# Patient Record
Sex: Female | Born: 1965 | Race: White | Hispanic: No | Marital: Single | Smoking: Never smoker
Health system: Southern US, Community
[De-identification: ages and names within clinical notes are randomized; demographics above are authoritative.]

## PROBLEM LIST (undated history)

## (undated) DIAGNOSIS — G4733 Obstructive sleep apnea (adult) (pediatric): Secondary | ICD-10-CM

## (undated) DIAGNOSIS — N179 Acute kidney failure, unspecified: Secondary | ICD-10-CM

## (undated) DIAGNOSIS — J189 Pneumonia, unspecified organism: Secondary | ICD-10-CM

## (undated) DIAGNOSIS — E785 Hyperlipidemia, unspecified: Secondary | ICD-10-CM

## (undated) DIAGNOSIS — I872 Venous insufficiency (chronic) (peripheral): Secondary | ICD-10-CM

## (undated) DIAGNOSIS — Z9981 Dependence on supplemental oxygen: Secondary | ICD-10-CM

## (undated) DIAGNOSIS — E039 Hypothyroidism, unspecified: Secondary | ICD-10-CM

## (undated) DIAGNOSIS — G43909 Migraine, unspecified, not intractable, without status migrainosus: Secondary | ICD-10-CM

## (undated) DIAGNOSIS — I10 Essential (primary) hypertension: Secondary | ICD-10-CM

## (undated) DIAGNOSIS — J45909 Unspecified asthma, uncomplicated: Secondary | ICD-10-CM

## (undated) DIAGNOSIS — R011 Cardiac murmur, unspecified: Secondary | ICD-10-CM

## (undated) DIAGNOSIS — Z9989 Dependence on other enabling machines and devices: Secondary | ICD-10-CM

## (undated) DIAGNOSIS — I82409 Acute embolism and thrombosis of unspecified deep veins of unspecified lower extremity: Secondary | ICD-10-CM

## (undated) DIAGNOSIS — B379 Candidiasis, unspecified: Secondary | ICD-10-CM

## (undated) DIAGNOSIS — N189 Chronic kidney disease, unspecified: Secondary | ICD-10-CM

## (undated) DIAGNOSIS — I509 Heart failure, unspecified: Secondary | ICD-10-CM

## (undated) DIAGNOSIS — J42 Unspecified chronic bronchitis: Secondary | ICD-10-CM

## (undated) HISTORY — PX: KNEE DISLOCATION SURGERY: SHX689

---

## 1998-06-06 ENCOUNTER — Other Ambulatory Visit: Admission: RE | Admit: 1998-06-06 | Discharge: 1998-06-06 | Payer: Self-pay | Admitting: Obstetrics & Gynecology

## 2016-01-19 ENCOUNTER — Inpatient Hospital Stay (HOSPITAL_COMMUNITY)
Admission: AD | Admit: 2016-01-19 | Discharge: 2016-01-31 | DRG: 286 | Disposition: A | Discharge status: Home Health | Payer: PRIVATE HEALTH INSURANCE | Source: Other Acute Inpatient Hospital | Attending: Internal Medicine | Admitting: Internal Medicine

## 2016-01-19 ENCOUNTER — Encounter (HOSPITAL_COMMUNITY): Payer: Self-pay | Admitting: Internal Medicine

## 2016-01-19 DIAGNOSIS — R778 Other specified abnormalities of plasma proteins: Secondary | ICD-10-CM

## 2016-01-19 DIAGNOSIS — E861 Hypovolemia: Secondary | ICD-10-CM | POA: Diagnosis not present

## 2016-01-19 DIAGNOSIS — E860 Dehydration: Secondary | ICD-10-CM | POA: Diagnosis not present

## 2016-01-19 DIAGNOSIS — G4733 Obstructive sleep apnea (adult) (pediatric): Secondary | ICD-10-CM | POA: Diagnosis not present

## 2016-01-19 DIAGNOSIS — I509 Heart failure, unspecified: Secondary | ICD-10-CM | POA: Diagnosis not present

## 2016-01-19 DIAGNOSIS — I872 Venous insufficiency (chronic) (peripheral): Secondary | ICD-10-CM | POA: Diagnosis present

## 2016-01-19 DIAGNOSIS — J9601 Acute respiratory failure with hypoxia: Secondary | ICD-10-CM | POA: Diagnosis present

## 2016-01-19 DIAGNOSIS — N170 Acute kidney failure with tubular necrosis: Secondary | ICD-10-CM | POA: Diagnosis not present

## 2016-01-19 DIAGNOSIS — E872 Acidosis: Secondary | ICD-10-CM | POA: Diagnosis present

## 2016-01-19 DIAGNOSIS — R197 Diarrhea, unspecified: Secondary | ICD-10-CM

## 2016-01-19 DIAGNOSIS — K5730 Diverticulosis of large intestine without perforation or abscess without bleeding: Secondary | ICD-10-CM | POA: Diagnosis present

## 2016-01-19 DIAGNOSIS — Z9989 Dependence on other enabling machines and devices: Secondary | ICD-10-CM

## 2016-01-19 DIAGNOSIS — R109 Unspecified abdominal pain: Secondary | ICD-10-CM | POA: Diagnosis present

## 2016-01-19 DIAGNOSIS — I248 Other forms of acute ischemic heart disease: Secondary | ICD-10-CM | POA: Diagnosis not present

## 2016-01-19 DIAGNOSIS — R578 Other shock: Secondary | ICD-10-CM | POA: Diagnosis not present

## 2016-01-19 DIAGNOSIS — R001 Bradycardia, unspecified: Secondary | ICD-10-CM

## 2016-01-19 DIAGNOSIS — Z880 Allergy status to penicillin: Secondary | ICD-10-CM

## 2016-01-19 DIAGNOSIS — E785 Hyperlipidemia, unspecified: Secondary | ICD-10-CM | POA: Diagnosis not present

## 2016-01-19 DIAGNOSIS — B379 Candidiasis, unspecified: Secondary | ICD-10-CM | POA: Diagnosis not present

## 2016-01-19 DIAGNOSIS — N189 Chronic kidney disease, unspecified: Secondary | ICD-10-CM | POA: Diagnosis present

## 2016-01-19 DIAGNOSIS — R1084 Generalized abdominal pain: Secondary | ICD-10-CM | POA: Diagnosis not present

## 2016-01-19 DIAGNOSIS — I5032 Chronic diastolic (congestive) heart failure: Secondary | ICD-10-CM | POA: Diagnosis present

## 2016-01-19 DIAGNOSIS — R079 Chest pain, unspecified: Secondary | ICD-10-CM | POA: Diagnosis present

## 2016-01-19 DIAGNOSIS — T80818A Extravasation of other vesicant agent, initial encounter: Secondary | ICD-10-CM | POA: Diagnosis not present

## 2016-01-19 DIAGNOSIS — J986 Disorders of diaphragm: Secondary | ICD-10-CM | POA: Diagnosis present

## 2016-01-19 DIAGNOSIS — K529 Noninfective gastroenteritis and colitis, unspecified: Secondary | ICD-10-CM | POA: Diagnosis present

## 2016-01-19 DIAGNOSIS — I272 Other secondary pulmonary hypertension: Secondary | ICD-10-CM | POA: Diagnosis not present

## 2016-01-19 DIAGNOSIS — R112 Nausea with vomiting, unspecified: Secondary | ICD-10-CM | POA: Diagnosis present

## 2016-01-19 DIAGNOSIS — R9431 Abnormal electrocardiogram [ECG] [EKG]: Secondary | ICD-10-CM

## 2016-01-19 DIAGNOSIS — F419 Anxiety disorder, unspecified: Secondary | ICD-10-CM | POA: Diagnosis not present

## 2016-01-19 DIAGNOSIS — I130 Hypertensive heart and chronic kidney disease with heart failure and stage 1 through stage 4 chronic kidney disease, or unspecified chronic kidney disease: Secondary | ICD-10-CM | POA: Diagnosis present

## 2016-01-19 DIAGNOSIS — I82431 Acute embolism and thrombosis of right popliteal vein: Secondary | ICD-10-CM | POA: Diagnosis present

## 2016-01-19 DIAGNOSIS — N179 Acute kidney failure, unspecified: Secondary | ICD-10-CM

## 2016-01-19 DIAGNOSIS — Z6841 Body Mass Index (BMI) 40.0 and over, adult: Secondary | ICD-10-CM

## 2016-01-19 DIAGNOSIS — I10 Essential (primary) hypertension: Secondary | ICD-10-CM | POA: Diagnosis present

## 2016-01-19 DIAGNOSIS — E875 Hyperkalemia: Secondary | ICD-10-CM | POA: Diagnosis present

## 2016-01-19 DIAGNOSIS — Z66 Do not resuscitate: Secondary | ICD-10-CM | POA: Diagnosis not present

## 2016-01-19 DIAGNOSIS — R7989 Other specified abnormal findings of blood chemistry: Secondary | ICD-10-CM

## 2016-01-19 DIAGNOSIS — I959 Hypotension, unspecified: Secondary | ICD-10-CM | POA: Diagnosis present

## 2016-01-19 DIAGNOSIS — J9600 Acute respiratory failure, unspecified whether with hypoxia or hypercapnia: Secondary | ICD-10-CM

## 2016-01-19 DIAGNOSIS — I4510 Unspecified right bundle-branch block: Secondary | ICD-10-CM | POA: Diagnosis present

## 2016-01-19 DIAGNOSIS — R579 Shock, unspecified: Secondary | ICD-10-CM | POA: Diagnosis present

## 2016-01-19 DIAGNOSIS — I214 Non-ST elevation (NSTEMI) myocardial infarction: Secondary | ICD-10-CM | POA: Insufficient documentation

## 2016-01-19 DIAGNOSIS — J9690 Respiratory failure, unspecified, unspecified whether with hypoxia or hypercapnia: Secondary | ICD-10-CM

## 2016-01-19 DIAGNOSIS — Z79899 Other long term (current) drug therapy: Secondary | ICD-10-CM

## 2016-01-19 DIAGNOSIS — E039 Hypothyroidism, unspecified: Secondary | ICD-10-CM | POA: Diagnosis not present

## 2016-01-19 DIAGNOSIS — E876 Hypokalemia: Secondary | ICD-10-CM | POA: Diagnosis not present

## 2016-01-19 DIAGNOSIS — R0902 Hypoxemia: Secondary | ICD-10-CM | POA: Insufficient documentation

## 2016-01-19 HISTORY — DX: Cardiac murmur, unspecified: R01.1

## 2016-01-19 HISTORY — DX: Heart failure, unspecified: I50.9

## 2016-01-19 HISTORY — DX: Chronic kidney disease, unspecified: N18.9

## 2016-01-19 HISTORY — DX: Migraine, unspecified, not intractable, without status migrainosus: G43.909

## 2016-01-19 HISTORY — DX: Unspecified asthma, uncomplicated: J45.909

## 2016-01-19 HISTORY — DX: Hyperlipidemia, unspecified: E78.5

## 2016-01-19 HISTORY — DX: Venous insufficiency (chronic) (peripheral): I87.2

## 2016-01-19 HISTORY — DX: Unspecified chronic bronchitis: J42

## 2016-01-19 HISTORY — DX: Hypothyroidism, unspecified: E03.9

## 2016-01-19 HISTORY — DX: Dependence on supplemental oxygen: Z99.81

## 2016-01-19 HISTORY — DX: Essential (primary) hypertension: I10

## 2016-01-19 HISTORY — DX: Acute embolism and thrombosis of unspecified deep veins of unspecified lower extremity: I82.409

## 2016-01-19 HISTORY — DX: Acute kidney failure, unspecified: N17.9

## 2016-01-19 HISTORY — DX: Dependence on other enabling machines and devices: Z99.89

## 2016-01-19 HISTORY — DX: Candidiasis, unspecified: B37.9

## 2016-01-19 HISTORY — DX: Pneumonia, unspecified organism: J18.9

## 2016-01-19 HISTORY — DX: Obstructive sleep apnea (adult) (pediatric): G47.33

## 2016-01-19 LAB — APTT: aPTT: 31 seconds (ref 24–37)

## 2016-01-19 LAB — LACTIC ACID, PLASMA: LACTIC ACID, VENOUS: 0.9 mmol/L (ref 0.5–1.9)

## 2016-01-19 LAB — COMPREHENSIVE METABOLIC PANEL
ALK PHOS: 52 U/L (ref 38–126)
ALT: 13 U/L — AB (ref 14–54)
AST: 11 U/L — AB (ref 15–41)
Albumin: 3.2 g/dL — ABNORMAL LOW (ref 3.5–5.0)
Anion gap: 16 — ABNORMAL HIGH (ref 5–15)
BILIRUBIN TOTAL: 0.9 mg/dL (ref 0.3–1.2)
BUN: 175 mg/dL — AB (ref 6–20)
CALCIUM: 7.5 mg/dL — AB (ref 8.9–10.3)
CO2: 11 mmol/L — ABNORMAL LOW (ref 22–32)
CREATININE: 7.58 mg/dL — AB (ref 0.44–1.00)
Chloride: 111 mmol/L (ref 101–111)
GFR, EST AFRICAN AMERICAN: 7 mL/min — AB (ref >60)
GFR, EST NON AFRICAN AMERICAN: 6 mL/min — AB (ref >60)
Glucose, Bld: 72 mg/dL (ref 65–99)
Potassium: 5.1 mmol/L (ref 3.5–5.1)
Sodium: 138 mmol/L (ref 135–145)
Total Protein: 6.5 g/dL (ref 6.5–8.1)

## 2016-01-19 LAB — BRAIN NATRIURETIC PEPTIDE: B Natriuretic Peptide: 467.4 pg/mL — ABNORMAL HIGH (ref 0.0–100.0)

## 2016-01-19 LAB — PROTIME-INR
INR: 1.35 (ref 0.00–1.49)
Prothrombin Time: 16.8 seconds — ABNORMAL HIGH (ref 11.6–15.2)

## 2016-01-19 MED ORDER — SODIUM CHLORIDE 0.9 % IV BOLUS (SEPSIS)
1000.0000 mL | Freq: Once | INTRAVENOUS | Status: AC
  Administered 2016-01-19: 1000 mL via INTRAVENOUS

## 2016-01-19 MED ORDER — ACETAMINOPHEN 325 MG PO TABS
650.0000 mg | ORAL_TABLET | ORAL | Status: DC | PRN
  Administered 2016-01-21 – 2016-01-30 (×2): 650 mg via ORAL
  Filled 2016-01-19: qty 2

## 2016-01-19 MED ORDER — ATORVASTATIN CALCIUM 40 MG PO TABS
40.0000 mg | ORAL_TABLET | Freq: Every day | ORAL | Status: DC
  Administered 2016-01-20 – 2016-01-30 (×11): 40 mg via ORAL
  Filled 2016-01-19 (×12): qty 1

## 2016-01-19 MED ORDER — SODIUM CHLORIDE 0.9 % IV SOLN
INTRAVENOUS | Status: DC
  Administered 2016-01-20 (×2): via INTRAVENOUS

## 2016-01-19 MED ORDER — CIPROFLOXACIN IN D5W 400 MG/200ML IV SOLN: 400.0000 mg | Freq: Two times a day (BID) | INTRAVENOUS | Status: DC

## 2016-01-19 MED ORDER — ASPIRIN EC 325 MG PO TBEC
325.0000 mg | DELAYED_RELEASE_TABLET | Freq: Every day | ORAL | Status: DC
  Administered 2016-01-20 – 2016-01-23 (×5): 325 mg via ORAL
  Filled 2016-01-19 (×5): qty 1

## 2016-01-19 MED ORDER — LEVOTHYROXINE SODIUM 75 MCG PO TABS
175.0000 ug | ORAL_TABLET | Freq: Every day | ORAL | Status: DC
  Administered 2016-01-20 – 2016-01-31 (×12): 175 ug via ORAL
  Filled 2016-01-19 (×14): qty 1

## 2016-01-19 MED ORDER — ALBUTEROL SULFATE (2.5 MG/3ML) 0.083% IN NEBU: 2.5000 mg | INHALATION_SOLUTION | RESPIRATORY_TRACT | Status: DC | PRN

## 2016-01-19 MED ORDER — HEPARIN SODIUM (PORCINE) 5000 UNIT/ML IJ SOLN
5000.0000 [IU] | Freq: Three times a day (TID) | INTRAMUSCULAR | Status: DC
  Administered 2016-01-20 (×2): 5000 [IU] via SUBCUTANEOUS
  Filled 2016-01-19 (×3): qty 1

## 2016-01-19 MED ORDER — FLUCONAZOLE 100 MG PO TABS
100.0000 mg | ORAL_TABLET | Freq: Every day | ORAL | Status: AC
  Administered 2016-01-20 – 2016-01-26 (×7): 100 mg via ORAL
  Filled 2016-01-19 (×9): qty 1

## 2016-01-19 MED ORDER — ONDANSETRON HCL 4 MG/2ML IJ SOLN
4.0000 mg | Freq: Four times a day (QID) | INTRAMUSCULAR | Status: DC | PRN
  Administered 2016-01-20 – 2016-01-21 (×6): 4 mg via INTRAVENOUS
  Filled 2016-01-19 (×6): qty 2

## 2016-01-19 MED ORDER — CIPROFLOXACIN IN D5W 400 MG/200ML IV SOLN
400.0000 mg | Freq: Every day | INTRAVENOUS | Status: DC
  Administered 2016-01-20 – 2016-01-21 (×2): 400 mg via INTRAVENOUS
  Filled 2016-01-19 (×4): qty 200

## 2016-01-19 MED ORDER — METRONIDAZOLE IN NACL 5-0.79 MG/ML-% IV SOLN
500.0000 mg | Freq: Three times a day (TID) | INTRAVENOUS | Status: DC
  Administered 2016-01-19 – 2016-01-22 (×8): 500 mg via INTRAVENOUS
  Filled 2016-01-19 (×10): qty 100

## 2016-01-19 MED ORDER — ONDANSETRON HCL 4 MG/2ML IJ SOLN: 4.0000 mg | Freq: Three times a day (TID) | INTRAMUSCULAR | Status: DC | PRN

## 2016-01-19 MED ORDER — MOMETASONE FURO-FORMOTEROL FUM 200-5 MCG/ACT IN AERO
2.0000 | INHALATION_SPRAY | Freq: Two times a day (BID) | RESPIRATORY_TRACT | Status: DC
  Administered 2016-01-21 – 2016-01-31 (×19): 2 via RESPIRATORY_TRACT
  Filled 2016-01-19 (×2): qty 8.8

## 2016-01-19 MED ORDER — MORPHINE SULFATE (PF) 2 MG/ML IV SOLN: 2.0000 mg | INTRAVENOUS | Status: DC | PRN

## 2016-01-19 MED ORDER — LEVOTHYROXINE SODIUM 100 MCG IV SOLR: 175.0000 ug | Freq: Every day | INTRAVENOUS | Status: DC

## 2016-01-19 MED ORDER — MORPHINE SULFATE (PF) 2 MG/ML IV SOLN
2.0000 mg | INTRAVENOUS | Status: DC | PRN
  Administered 2016-01-20: 2 mg via INTRAVENOUS
  Filled 2016-01-19: qty 1

## 2016-01-19 NOTE — Significant Event (Addendum)
Rapid Response Event Note Asked to see Patient while rounding on floor . Per floor RN [facility]. Pt admitted for nausea diarrhea for 3 weeks at home.  [facility] MD  Dr. Clyde LundborgNiu at bedside writing admission orders. Manual BP in process.   Overview: Time Called: 2215 Arrival Time: 2215 Event Type: Cardiac, Hypotension  Initial Focused Assessment: Pt found resting in bed lethargic but responsive oriented x 4. Complains of chest pain radiating from left chest to left arm, left arm numbness. Per RN Pt complained of similar pain [facility] and received fentanyl which resolved pain. Pt also complains of nausea. Lungs clear no respiratory distress, po2 98% on 2 LNC. ABD soft, obese. Automatic BP 75/43  Interventions: Manual BP obtained yielding 62/40. EKG reviewed per Dr. Clyde LundborgNiu at bedside. NS bolus infusing, per floor RN this is her 4 th fluid bolus tonight as she received 3 L [facility]. Labs ordered and completed STAT. PCCM consulted per MD. Cherie Dark. Dasai PCCM P.A at bedsdie 2320 to assess Patient. For transfer as SDU overflow to ICU.  Pt transferred to 101M at 0020, care assumed per 9515m RN. PCCM to monitor tonight.   Event Summary: Name of Physician Notified: Dr. Clyde LundborgNiu  at 2215    at          [facility], Adiya Selmer Leigh Eithel Ryall

## 2016-01-19 NOTE — Progress Notes (Signed)
Pharmacy Antibiotic Note  Bonnie Krause is a 50 y.o. female admitted on 01/19/2016 with intra-abdominal infection, possible colitis.  Pharmacy has been consulted for cipro dosing.  Baseline labs pending.  Plan: Cipro 400 mg IV q 12 hrs for now.  F/u renal function.     Temp (24hrs), Avg:97.7 F (36.5 C), Min:97.7 F (36.5 C), Max:97.7 F (36.5 C)  No results for input(s): WBC, CREATININE, LATICACIDVEN, VANCOTROUGH, VANCOPEAK, VANCORANDOM, GENTTROUGH, GENTPEAK, GENTRANDOM, TOBRATROUGH, TOBRAPEAK, TOBRARND, AMIKACINPEAK, AMIKACINTROU, AMIKACIN in the last 168 hours.  CrCl cannot be calculated (Unknown ideal weight.).    Allergies not on file  Antimicrobials this admission: Cipro 7/10 > Flagyl 7/10 >  Dose adjustments this admission:   Microbiology results: 7/10 BCx: * ** UCx: *  ** Sputum: ** ** MRSA PCR: ** Thank you for allowing pharmacy to be a part of this patient's care.  Bonnie Krause, Pharm D, BCPS  Clinical Pharmacist Pager [phone]  01/19/2016 10:33 PM

## 2016-01-19 NOTE — H&P (Addendum)
History and Physical    Bonnie Krause ZOX:[id number] DOB: 03-16-1966 DOA: 01/19/2016  Referring MD/NP/PA:   PCP: No primary care provider on file.   Patient coming from:  The patient is coming from home.  At baseline, pt is independent for most of ADL.   Chief Complaint: Nausea, vomiting, diarrhea, abdominal pain and chest pain  HPI: Bonnie Krause is a 50 y.o. female with medical history significant of hypertension, hyperlipidemia, hypothyroidism, congestive heart failure, chronic venous insufficiency, possible CKD (last Cre was reportedly 2.0 on 12/2015), currently being treated for yeast infection, who presents with nausea, vomiting, diarrhea, abdominal pain or chest pain.  Patient was transferred from [facility].  Pt reports that she has been having nausea, vomiting, diarrhea, abdominal pain for almost 2 weeks. She vomited 2-8 times each day, but not today. She has 4 to 5 watery bowel movement each day. Her abdominal pain is located into the upper abdomen, constant, 9 out of 10 in severity, nonradiating. It is not aggravated or alleviated with any normal factors. Patient has subjective fever and chills. Patient also reports chest pain. It is located in both sides of chest, constant, pressure-like, nonradiating. It is not pleuritic. It is not aggravated by deep breath. No tenderness in calf areas. Patient does not have vaginal burning, but symptoms of UTI, unilateral weakness, vision change or hearing loss.  At arrival to floor, pt is hypotensive with blood pressure 64/40-75/43, mental status normal.   Pt was initially evaluated in [facility] and was found to have lipase 532, negative troponin, likely due to 0.8, negative urinalysis, WBC 8.1, temperature normal, no tachycardia, potassium 5.6 without EKG change, creatinine is up from 2 to 9.50. Chest x-ray showed borderline cardiomegaly, vascular congestion, mild elevation of the right hemidiaphragm. CT-abd/pelvis showed  extensive diverticulosis of sigmoid without diverticulitis. Suspect colitis of infectious or inflammatory nature of ascending colon and cecum. Lymph nodes likely reactive. 3mm calculus within porta hepatis, suspicious for small stone within gallbladder neck or cystic duct. No evidence of cholecystitis.   Review of Systems:   General: has subjective fevers, chills, no changes in body weight, has poor appetite, has fatigue HEENT: no blurry vision, hearing changes or sore throat Pulm: no dyspnea, coughing, wheezing CV: no chest pain, no palpitations Abd: has nausea, vomiting, abdominal pain, diarrhea, no constipation GU: no dysuria, burning on urination, increased urinary frequency, hematuria  Ext: no leg edema Neuro: no unilateral weakness, numbness, or tingling, no vision change or hearing loss Skin: no rash MSK: No muscle spasm, no deformity, no limitation of range of movement in spin Heme: No easy bruising.  Travel history: No recent long distant travel.  Allergy:  Allergies  Allergen Reactions  . Penicillins Anaphylaxis    Past Medical History  Diagnosis Date  . Essential hypertension   . HLD (hyperlipidemia)   . Hypothyroidism   . Chronic CHF (congestive heart failure) (HCC)   . CKD (chronic kidney disease)   . Chronic venous insufficiency   . Yeast infection     Past Surgical History  Procedure Laterality Date  . Knee surgery      Social History:  has no tobacco, alcohol, and drug history on file.  Family History:  Family History  Problem Relation Age of Onset  . Diabetes Mother   . Heart disease Mother      Prior to Admission medications   Not on File    Physical Exam: Filed Vitals:   01/20/16 0400 01/20/16 0402 01/20/16 0500  01/20/16 0600  BP: 93/53  91/50 84/48  Pulse: 70  73 65  Temp:  98.2 F (36.8 C)    TempSrc:  Oral    Resp: Height:      Weight:      SpO2: 100%  100% 100%   General: Not in acute distress HEENT:       Eyes:  PERRL, EOMI, no scleral icterus.       ENT: No discharge from the ears and nose, no pharynx injection, no tonsillar enlargement.        Neck: No JVD, no bruit, no mass felt. Heme: No neck lymph node enlargement. Cardiac: S1/S2, RRR, No murmurs, No gallops or rubs. Pulm: No rales, wheezing, rhonchi or rubs. Abd: Soft, nondistended, tenderness over upper abdomen, no rebound pain, no organomegaly, BS present. GU: No hematuria Ext: No pitting leg edema bilaterally. 2+DP/PT pulse bilaterally. Has bilateral venous insufficient change. Musculoskeletal: No joint deformities, No joint redness or warmth, no limitation of ROM in spin. Skin: No rashes.  Neuro: Alert, oriented X3, cranial nerves II-XII grossly intact, moves all extremities normally.  Psych: Patient is not psychotic, no suicidal or hemocidal ideation.  Labs on Admission: I have personally reviewed following labs and imaging studies  CBC:  Recent Labs Lab 01/19/16 2253  WBC 6.5  NEUTROABS 4.1  HGB 11.6*  HCT 35.7*  MCV 86.9  PLT 188   Basic Metabolic Panel:  Recent Labs Lab 01/19/16 2253 01/20/16 0219  NA 138 140  K 5.1 5.0  CL 111 114*  CO2 11* 11*  GLUCOSE 72 70  BUN 175* 164*  CREATININE 7.58* 6.64*  CALCIUM 7.5* 7.1*   GFR: Estimated Creatinine Clearance: 13.2 mL/min (by C-G formula based on Cr of 6.64). Liver Function Tests:  Recent Labs Lab 01/19/16 2253 01/20/16 0219  AST 11* 10*  ALT 13* 12*  ALKPHOS 52 48  BILITOT 0.9 0.9  PROT 6.5 6.1*  ALBUMIN 3.2* 3.0*    Recent Labs Lab 01/20/16 0219  LIPASE 75*  AMYLASE 74   No results for input(s): AMMONIA in the last 168 hours. Coagulation Profile:  Recent Labs Lab 01/19/16 2253  INR 1.35   Cardiac Enzymes: No results for input(s): CKTOTAL, CKMB, CKMBINDEX, TROPONINI in the last 168 hours. BNP (last 3 results) No results for input(s): PROBNP in the last 8760 hours. HbA1C: No results for input(s): HGBA1C in the last 72 hours. CBG: No  results for input(s): GLUCAP in the last 168 hours. Lipid Profile:  Recent Labs  01/20/16 0505  CHOL 85  HDL 17*  LDLCALC 38  TRIG 161  CHOLHDL 5.0   Thyroid Function Tests:  Recent Labs  01/20/16 0505  TSH 2.268   Anemia Panel: No results for input(s): VITAMINB12, FOLATE, FERRITIN, TIBC, IRON, RETICCTPCT in the last 72 hours. Urine analysis: No results found for: COLORURINE, APPEARANCEUR, LABSPEC, PHURINE, GLUCOSEU, HGBUR, BILIRUBINUR, KETONESUR, PROTEINUR, UROBILINOGEN, NITRITE, LEUKOCYTESUR Sepsis Labs: (procalcitonin:4,lacticidven:4) ) Recent Results (from the past 240 hour(s))  Culture, blood (x 2)     Status: None (Preliminary result)   Collection Time: 01/19/16 10:55 PM  Result Value Ref Range Status   Specimen Description BLOOD RIGHT ARM  Final   Special Requests IN PEDIATRIC BOTTLE 1CC  Final   Culture PENDING  Incomplete   Report Status PENDING  Incomplete  MRSA PCR Screening     Status: None   Collection Time: 01/20/16 12:29 AM  Result Value Ref Range Status   MRSA  by PCR NEGATIVE NEGATIVE Final    Comment:        The GeneXpert MRSA Assay (FDA approved for NASAL specimens only), is one component of a comprehensive MRSA colonization surveillance program. It is not intended to diagnose MRSA infection nor to guide or monitor treatment for MRSA infections.      Radiological Exams on Admission: No results found.   EKG: Independently reviewed.  Not done in ED, will get one.   Assessment/Plan   Principal Problem:   Nausea, vomiting, and diarrhea Active Problems:   Chest pain   Essential hypertension   HLD (hyperlipidemia)   Hypothyroidism   Chronic CHF (congestive heart failure) (HCC)   Yeast infection   Abdominal pain   Hypotension   OSA (obstructive sleep apnea)   Shock circulatory (HCC)   Acute renal failure (ARF) (HCC)   Hyperkalemia   Nausea, vomiting, diarrhea and AP: Etiology is not clear. CT abdomen/pelvis that showed  possible colitis. Patient has an elevated lipase 537, indicating possible pancreatitis. She has normal transaminases and total bilirubin, indicating less likely to have biliary stone-induced pancreatitis. Patient denies drinking alcohol. -pt is admitted to SDU -started IV flagyl and cipro empirically -IVF: Patient received 3 L of normal saline bolus in [facility] as reported. Will give another 1.5 L NS and then 125 cc/h -check c diff pcr and GI path panel. -When necessary Zofran for nausea -NPO  Shock circulatory [facility](HCC): Patient is hypotensive on admission, with normal lactate level and mental status. This is most likely due to volume depletion secondary to nausea, vomiting and diarrhea, though cannot completely rule out sepsis. -IV fluid resuscitation as above -PCCM is on board-->Phenylephrine gtt -check cortisol level -Solu Cortef 50 mg every 6 hours  HTN: now pt has hypotension -hold home bp meds  Chest pain: Patient has TWI V3-V6 and inferior leads. Initial trop[facility] in [facility]. Likely due demanding ischemia secondary to hypotension. - cycle CE q6 x3 and repeat her EKG  - Aspirin, lipitor  - Risk factor stratification: will check FLP and A1C  - 2d echo - please call Card in AM  HLD: Last LDL was not on record -Continue home medications: lipitor -Check FLP  Hypothyroidism: Last TSH was not on record -Continue home Synthroid -Check TSH  Chronic CHF (congestive heart failure) (HCC): No 2-D echo on record, not sure which type of CHF. Patient does not have leg edema or DVT. CHF is compensated. -Hold her Lasix and spironolactone -on aspirin -Check BNP which may not be accurate due to worsening renal function  Possible AoCKD vs AKI: cre is up from 2 to 9.5 without hydronephrosis on CT abdomen/pelvis. This is likely due to prerenal failure versus ATN due to shock. -check FeUrea -check US-renal -renal, Dr Briant CedarMattingly was consulted--> will f/u  recommendations.  Yeast infection: -Diflucan  Hyperkalemia: K=5.6 without EKG change. Cannot give Kayexalate due to nausea, vomiting and diarrhea.  -will continue IV fluid as above, hoping correction with IV fluids.   DVT ppx: SQ Heparin   Code Status: Full code Family Communication: None at bed side.  Disposition Plan:  Anticipate discharge back to previous home environment Consults called:  Renal, Dr. Briant CedarMattingly; PCCM, Dr. Marchelle Gearingamaswamy Admission status: SDU/obs       Date of Service 01/20/2016    Lorretta HarpNIU, Bonnie Krause [facility] Pager [phone]  If 7PM-7AM, please contact night-coverage [email or web address] Password [email or web address] 01/20/2016, 7:08 AM

## 2016-01-20 ENCOUNTER — Observation Stay (HOSPITAL_COMMUNITY): Payer: PRIVATE HEALTH INSURANCE

## 2016-01-20 ENCOUNTER — Other Ambulatory Visit (HOSPITAL_COMMUNITY): Payer: PRIVATE HEALTH INSURANCE

## 2016-01-20 ENCOUNTER — Encounter (HOSPITAL_COMMUNITY): Payer: Self-pay | Admitting: Internal Medicine

## 2016-01-20 ENCOUNTER — Inpatient Hospital Stay (HOSPITAL_COMMUNITY): Payer: PRIVATE HEALTH INSURANCE

## 2016-01-20 DIAGNOSIS — G4733 Obstructive sleep apnea (adult) (pediatric): Secondary | ICD-10-CM | POA: Diagnosis present

## 2016-01-20 DIAGNOSIS — R0902 Hypoxemia: Secondary | ICD-10-CM | POA: Diagnosis not present

## 2016-01-20 DIAGNOSIS — R1010 Upper abdominal pain, unspecified: Secondary | ICD-10-CM | POA: Diagnosis not present

## 2016-01-20 DIAGNOSIS — R112 Nausea with vomiting, unspecified: Secondary | ICD-10-CM | POA: Diagnosis present

## 2016-01-20 DIAGNOSIS — I248 Other forms of acute ischemic heart disease: Secondary | ICD-10-CM | POA: Diagnosis present

## 2016-01-20 DIAGNOSIS — I82431 Acute embolism and thrombosis of right popliteal vein: Secondary | ICD-10-CM | POA: Diagnosis not present

## 2016-01-20 DIAGNOSIS — N179 Acute kidney failure, unspecified: Secondary | ICD-10-CM | POA: Diagnosis present

## 2016-01-20 DIAGNOSIS — R9431 Abnormal electrocardiogram [ECG] [EKG]: Secondary | ICD-10-CM

## 2016-01-20 DIAGNOSIS — Z880 Allergy status to penicillin: Secondary | ICD-10-CM | POA: Diagnosis not present

## 2016-01-20 DIAGNOSIS — R778 Other specified abnormalities of plasma proteins: Secondary | ICD-10-CM

## 2016-01-20 DIAGNOSIS — R079 Chest pain, unspecified: Secondary | ICD-10-CM | POA: Diagnosis not present

## 2016-01-20 DIAGNOSIS — J9601 Acute respiratory failure with hypoxia: Secondary | ICD-10-CM | POA: Diagnosis not present

## 2016-01-20 DIAGNOSIS — I209 Angina pectoris, unspecified: Secondary | ICD-10-CM | POA: Diagnosis not present

## 2016-01-20 DIAGNOSIS — E875 Hyperkalemia: Secondary | ICD-10-CM | POA: Diagnosis not present

## 2016-01-20 DIAGNOSIS — E860 Dehydration: Secondary | ICD-10-CM | POA: Diagnosis present

## 2016-01-20 DIAGNOSIS — R197 Diarrhea, unspecified: Secondary | ICD-10-CM

## 2016-01-20 DIAGNOSIS — B379 Candidiasis, unspecified: Secondary | ICD-10-CM | POA: Diagnosis present

## 2016-01-20 DIAGNOSIS — E872 Acidosis: Secondary | ICD-10-CM | POA: Diagnosis present

## 2016-01-20 DIAGNOSIS — I5032 Chronic diastolic (congestive) heart failure: Secondary | ICD-10-CM | POA: Diagnosis present

## 2016-01-20 DIAGNOSIS — I872 Venous insufficiency (chronic) (peripheral): Secondary | ICD-10-CM | POA: Diagnosis present

## 2016-01-20 DIAGNOSIS — R609 Edema, unspecified: Secondary | ICD-10-CM | POA: Diagnosis not present

## 2016-01-20 DIAGNOSIS — R001 Bradycardia, unspecified: Secondary | ICD-10-CM

## 2016-01-20 DIAGNOSIS — K529 Noninfective gastroenteritis and colitis, unspecified: Secondary | ICD-10-CM | POA: Diagnosis present

## 2016-01-20 DIAGNOSIS — I130 Hypertensive heart and chronic kidney disease with heart failure and stage 1 through stage 4 chronic kidney disease, or unspecified chronic kidney disease: Secondary | ICD-10-CM | POA: Diagnosis present

## 2016-01-20 DIAGNOSIS — R7989 Other specified abnormal findings of blood chemistry: Secondary | ICD-10-CM | POA: Diagnosis not present

## 2016-01-20 DIAGNOSIS — N170 Acute kidney failure with tubular necrosis: Secondary | ICD-10-CM

## 2016-01-20 DIAGNOSIS — I272 Other secondary pulmonary hypertension: Secondary | ICD-10-CM | POA: Diagnosis present

## 2016-01-20 DIAGNOSIS — K5730 Diverticulosis of large intestine without perforation or abscess without bleeding: Secondary | ICD-10-CM | POA: Diagnosis present

## 2016-01-20 DIAGNOSIS — I82402 Acute embolism and thrombosis of unspecified deep veins of left lower extremity: Secondary | ICD-10-CM | POA: Diagnosis not present

## 2016-01-20 DIAGNOSIS — I10 Essential (primary) hypertension: Secondary | ICD-10-CM | POA: Diagnosis not present

## 2016-01-20 DIAGNOSIS — N189 Chronic kidney disease, unspecified: Secondary | ICD-10-CM | POA: Diagnosis present

## 2016-01-20 DIAGNOSIS — T80818A Extravasation of other vesicant agent, initial encounter: Secondary | ICD-10-CM | POA: Diagnosis not present

## 2016-01-20 DIAGNOSIS — R578 Other shock: Secondary | ICD-10-CM | POA: Diagnosis present

## 2016-01-20 DIAGNOSIS — J9600 Acute respiratory failure, unspecified whether with hypoxia or hypercapnia: Secondary | ICD-10-CM | POA: Insufficient documentation

## 2016-01-20 DIAGNOSIS — E039 Hypothyroidism, unspecified: Secondary | ICD-10-CM | POA: Diagnosis present

## 2016-01-20 DIAGNOSIS — E861 Hypovolemia: Secondary | ICD-10-CM | POA: Diagnosis present

## 2016-01-20 DIAGNOSIS — R579 Shock, unspecified: Secondary | ICD-10-CM | POA: Diagnosis present

## 2016-01-20 DIAGNOSIS — Z6841 Body Mass Index (BMI) 40.0 and over, adult: Secondary | ICD-10-CM | POA: Diagnosis not present

## 2016-01-20 DIAGNOSIS — E785 Hyperlipidemia, unspecified: Secondary | ICD-10-CM | POA: Diagnosis present

## 2016-01-20 LAB — CBC WITH DIFFERENTIAL/PLATELET
BASOS ABS: 0 10*3/uL (ref 0.0–0.1)
BASOS PCT: 0 %
Basophils Absolute: 0 10*3/uL (ref 0.0–0.1)
Basophils Relative: 0 %
EOS ABS: 0.1 10*3/uL (ref 0.0–0.7)
EOS PCT: 2 %
Eosinophils Absolute: 0 10*3/uL (ref 0.0–0.7)
Eosinophils Relative: 0 %
HCT: 35.7 % — ABNORMAL LOW (ref 36.0–46.0)
HEMATOCRIT: 36.1 % (ref 36.0–46.0)
HEMOGLOBIN: 12.1 g/dL (ref 12.0–15.0)
Hemoglobin: 11.6 g/dL — ABNORMAL LOW (ref 12.0–15.0)
LYMPHS ABS: 1.8 10*3/uL (ref 0.7–4.0)
LYMPHS PCT: 17 %
Lymphocytes Relative: 28 %
Lymphs Abs: 1.1 10*3/uL (ref 0.7–4.0)
MCH: 28.2 pg (ref 26.0–34.0)
MCH: 28.7 pg (ref 26.0–34.0)
MCHC: 32.5 g/dL (ref 30.0–36.0)
MCHC: 33.5 g/dL (ref 30.0–36.0)
MCV: 86.9 fL (ref 78.0–100.0)
MCV: 85.5 fL (ref 78.0–100.0)
MONO ABS: 0.5 10*3/uL (ref 0.1–1.0)
Monocytes Absolute: 0.2 10*3/uL (ref 0.1–1.0)
Monocytes Relative: 7 %
Monocytes Relative: 3 %
NEUTROS ABS: 5.2 10*3/uL (ref 1.7–7.7)
NEUTROS PCT: 63 %
NEUTROS PCT: 80 %
Neutro Abs: 4.1 10*3/uL (ref 1.7–7.7)
PLATELETS: 188 10*3/uL (ref 150–400)
Platelets: 231 10*3/uL (ref 150–400)
RBC: 4.11 MIL/uL (ref 3.87–5.11)
RBC: 4.22 MIL/uL (ref 3.87–5.11)
RDW: 15 % (ref 11.5–15.5)
RDW: 15.2 % (ref 11.5–15.5)
WBC: 6.5 10*3/uL (ref 4.0–10.5)
WBC: 6.5 10*3/uL (ref 4.0–10.5)

## 2016-01-20 LAB — AMYLASE
AMYLASE: 76 U/L (ref 28–100)
Amylase: 74 U/L (ref 28–100)

## 2016-01-20 LAB — TSH: TSH: 2.268 u[IU]/mL (ref 0.350–4.500)

## 2016-01-20 LAB — BASIC METABOLIC PANEL
ANION GAP: 14 (ref 5–15)
Anion gap: 15 (ref 5–15)
Anion gap: 14 (ref 5–15)
BUN: 164 mg/dL — ABNORMAL HIGH (ref 6–20)
BUN: 150 mg/dL — ABNORMAL HIGH (ref 6–20)
BUN: 139 mg/dL — AB (ref 6–20)
CHLORIDE: 116 mmol/L — AB (ref 101–111)
CHLORIDE: 114 mmol/L — AB (ref 101–111)
CO2: 11 mmol/L — ABNORMAL LOW (ref 22–32)
CO2: 11 mmol/L — AB (ref 22–32)
CO2: 13 mmol/L — ABNORMAL LOW (ref 22–32)
CREATININE: 4.25 mg/dL — AB (ref 0.44–1.00)
Calcium: 7.1 mg/dL — ABNORMAL LOW (ref 8.9–10.3)
Calcium: 7.6 mg/dL — ABNORMAL LOW (ref 8.9–10.3)
Calcium: 7.8 mg/dL — ABNORMAL LOW (ref 8.9–10.3)
Chloride: 114 mmol/L — ABNORMAL HIGH (ref 101–111)
Creatinine, Ser: 6.64 mg/dL — ABNORMAL HIGH (ref 0.44–1.00)
Creatinine, Ser: 4.93 mg/dL — ABNORMAL HIGH (ref 0.44–1.00)
GFR calc Af Amer: 8 mL/min — ABNORMAL LOW (ref >60)
GFR calc Af Amer: 11 mL/min — ABNORMAL LOW (ref >60)
GFR calc Af Amer: 13 mL/min — ABNORMAL LOW (ref >60)
GFR calc non Af Amer: 7 mL/min — ABNORMAL LOW (ref >60)
GFR calc non Af Amer: 9 mL/min — ABNORMAL LOW (ref >60)
GFR calc non Af Amer: 11 mL/min — ABNORMAL LOW (ref >60)
GLUCOSE: 85 mg/dL (ref 65–99)
GLUCOSE: 90 mg/dL (ref 65–99)
Glucose, Bld: 70 mg/dL (ref 65–99)
POTASSIUM: 4.7 mmol/L (ref 3.5–5.1)
Potassium: 5 mmol/L (ref 3.5–5.1)
Potassium: 5 mmol/L (ref 3.5–5.1)
Sodium: 140 mmol/L (ref 135–145)
Sodium: 141 mmol/L (ref 135–145)
Sodium: 141 mmol/L (ref 135–145)

## 2016-01-20 LAB — MRSA PCR SCREENING: MRSA by PCR: NEGATIVE

## 2016-01-20 LAB — LACTIC ACID, PLASMA: Lactic Acid, Venous: 0.5 mmol/L (ref 0.5–1.9)

## 2016-01-20 LAB — HEPATIC FUNCTION PANEL
ALT: 12 U/L — AB (ref 14–54)
AST: 10 U/L — AB (ref 15–41)
Albumin: 3 g/dL — ABNORMAL LOW (ref 3.5–5.0)
Alkaline Phosphatase: 48 U/L (ref 38–126)
BILIRUBIN DIRECT: 0.1 mg/dL (ref 0.1–0.5)
BILIRUBIN INDIRECT: 0.8 mg/dL (ref 0.3–0.9)
BILIRUBIN TOTAL: 0.9 mg/dL (ref 0.3–1.2)
Total Protein: 6.1 g/dL — ABNORMAL LOW (ref 6.5–8.1)

## 2016-01-20 LAB — PHOSPHORUS: PHOSPHORUS: 8.2 mg/dL — AB (ref 2.5–4.6)

## 2016-01-20 LAB — HEMOGLOBIN A1C
HEMOGLOBIN A1C: 6.1 % — AB (ref 4.8–5.6)
MEAN PLASMA GLUCOSE: 128 mg/dL

## 2016-01-20 LAB — LIPID PANEL
CHOLESTEROL: 85 mg/dL (ref 0–200)
HDL: 17 mg/dL — AB (ref >40)
LDL Cholesterol: 38 mg/dL (ref 0–99)
TRIGLYCERIDES: 149 mg/dL (ref <150)
Total CHOL/HDL Ratio: 5 RATIO
VLDL: 30 mg/dL (ref 0–40)

## 2016-01-20 LAB — RAPID URINE DRUG SCREEN, HOSP PERFORMED
AMPHETAMINES: NOT DETECTED
Barbiturates: NOT DETECTED
Benzodiazepines: NOT DETECTED
Cocaine: NOT DETECTED
Opiates: NOT DETECTED
Tetrahydrocannabinol: NOT DETECTED

## 2016-01-20 LAB — TROPONIN I
Troponin I: 0.41 ng/mL (ref <0.03)
Troponin I: 0.81 ng/mL (ref <0.03)
Troponin I: 0.63 ng/mL (ref <0.03)

## 2016-01-20 LAB — PROCALCITONIN: Procalcitonin: 0.78 ng/mL

## 2016-01-20 LAB — LIPASE, BLOOD
Lipase: 75 U/L — ABNORMAL HIGH (ref 11–51)
Lipase: 66 U/L — ABNORMAL HIGH (ref 11–51)

## 2016-01-20 LAB — CREATININE, URINE, RANDOM: Creatinine, Urine: 89.17 mg/dL

## 2016-01-20 LAB — MAGNESIUM: Magnesium: 1.6 mg/dL — ABNORMAL LOW (ref 1.7–2.4)

## 2016-01-20 LAB — HEPARIN LEVEL (UNFRACTIONATED): Heparin Unfractionated: 0.52 IU/mL (ref 0.30–0.70)

## 2016-01-20 LAB — CORTISOL: Cortisol, Plasma: 19.1 ug/dL

## 2016-01-20 MED ORDER — ATROPINE SULFATE 1 MG/10ML IJ SOSY
PREFILLED_SYRINGE | INTRAMUSCULAR | Status: AC
  Filled 2016-01-20: qty 10

## 2016-01-20 MED ORDER — HYDROCORTISONE NA SUCCINATE PF 100 MG IJ SOLR
50.0000 mg | Freq: Four times a day (QID) | INTRAMUSCULAR | Status: DC
  Administered 2016-01-20 – 2016-01-21 (×5): 50 mg via INTRAVENOUS
  Filled 2016-01-20 (×3): qty 1
  Filled 2016-01-20: qty 2
  Filled 2016-01-20 (×2): qty 1

## 2016-01-20 MED ORDER — PHENYLEPHRINE HCL 10 MG/ML IJ SOLN
30.0000 ug/min | INTRAVENOUS | Status: DC
  Administered 2016-01-20: 40 ug/min via INTRAVENOUS
  Administered 2016-01-20: 70 ug/min via INTRAVENOUS
  Filled 2016-01-20 (×2): qty 1

## 2016-01-20 MED ORDER — PHENYLEPHRINE HCL 10 MG/ML IJ SOLN
30.0000 ug/min | INTRAVENOUS | Status: DC
  Filled 2016-01-20 (×2): qty 4

## 2016-01-20 MED ORDER — STERILE WATER FOR INJECTION IV SOLN
INTRAVENOUS | Status: DC
  Administered 2016-01-20 – 2016-01-22 (×5): via INTRAVENOUS
  Filled 2016-01-20 (×9): qty 850

## 2016-01-20 MED ORDER — SODIUM CHLORIDE 0.9 % IV SOLN
1.0000 g | Freq: Once | INTRAVENOUS | Status: AC
  Administered 2016-01-20: 1 g via INTRAVENOUS
  Filled 2016-01-20: qty 10

## 2016-01-20 MED ORDER — FENTANYL CITRATE (PF) 100 MCG/2ML IJ SOLN
25.0000 ug | INTRAMUSCULAR | Status: DC | PRN
  Administered 2016-01-23 (×2): 25 ug via INTRAVENOUS
  Administered 2016-01-24 (×2): 50 ug via INTRAVENOUS
  Administered 2016-01-25 (×2): 25 ug via INTRAVENOUS
  Administered 2016-01-25 – 2016-01-29 (×2): 50 ug via INTRAVENOUS
  Filled 2016-01-20 (×7): qty 2

## 2016-01-20 MED ORDER — SODIUM CHLORIDE 0.9 % IV BOLUS (SEPSIS)
1000.0000 mL | Freq: Once | INTRAVENOUS | Status: AC
  Administered 2016-01-20: 1000 mL via INTRAVENOUS

## 2016-01-20 MED ORDER — PHENYLEPHRINE HCL 10 MG/ML IJ SOLN
30.0000 ug/min | INTRAVENOUS | Status: DC
  Administered 2016-01-20: 50 ug/min via INTRAVENOUS
  Administered 2016-01-20: 75 ug/min via INTRAVENOUS
  Filled 2016-01-20 (×2): qty 1

## 2016-01-20 MED ORDER — HEPARIN BOLUS VIA INFUSION
4000.0000 [IU] | Freq: Once | INTRAVENOUS | Status: AC
  Administered 2016-01-20: 4000 [IU] via INTRAVENOUS
  Filled 2016-01-20: qty 4000

## 2016-01-20 MED ORDER — HEPARIN (PORCINE) IN NACL 100-0.45 UNIT/ML-% IJ SOLN
1100.0000 [IU]/h | INTRAMUSCULAR | Status: DC
  Administered 2016-01-20 – 2016-01-22 (×3): 1100 [IU]/h via INTRAVENOUS
  Filled 2016-01-20 (×7): qty 250

## 2016-01-20 NOTE — Procedures (Signed)
Arterial Catheter Insertion Procedure Note Lane Hackeraula Elaine Rossa [id number] 1966/03/29  Procedure: Insertion of Arterial Catheter  Indications: Blood pressure monitoring and Frequent blood sampling  Procedure Details Consent: Risks of procedure as well as the alternatives and risks of each were explained to the (patient/caregiver).  Consent for procedure obtained. Time Out: Verified patient identification, verified procedure, site/side was marked, verified correct patient position, special equipment/implants available, medications/allergies/relevent history reviewed, required imaging and test results available.  Performed  Maximum sterile technique was used including antiseptics, cap, gloves, gown, hand hygiene, mask and sheet. Skin prep: Chlorhexidine; local anesthetic administered 20 gauge catheter was inserted into left radial artery using the Seldinger technique.  Evaluation Blood flow good; BP tracing good. Complications: No apparent complications.   Rutherford Guysahul Kayelee Herbig, [place] - [facility] Pager: [phone]  or [phone] 01/20/2016, 1:15 AM

## 2016-01-20 NOTE — Consult Note (Signed)
PULMONARY / CRITICAL CARE MEDICINE   Name: Bonnie Krause MRN: [id number] DOB: 04-16-1966    ADMISSION DATE:  01/19/2016 CONSULTATION DATE:  01/19/16  REFERRING MD:  Clyde Lundborg [facility])  CHIEF COMPLAINT:  N/V/D, hypotension  HISTORY OF PRESENT ILLNESS:   Bonnie Krause is a 50 y.o. female with PMH as outlined below. She was seen by PCP 7/10 for N/V/D, abd pain, generalized weakness x 3 - 4 weeks.  At PCP office, she was found to be hypotensive so was sent to [facility] for further evaluation.  At [facility], she had BP in 70's and was found to have acute renal failure with SCr 9.50.  She was transferred to [facility] for further evaluation and management and was admitted by [facility] team.  PCCM was called later that evening for persistent hypotension with SBP in 70's - 80's.  Of note, she has remained awake with normal mental status since admission and state she occasionally has low BP but is unable to tell me what her pressures typically run.  She states N/V/D have been going on for roughly 3 - 4 weeks and have been associated with abd pain.  Diarrhea has been as many as 8 times per night, vomiting 2 - 3 times per day.  She has not tolerated any food as she has never been hungry.  Has been trying to drink as much water as possible, occasionally able to keep it down.  Has had intermittent subjective fevers and chills.  Denies any hematochezia or melena. Has had dull chest tightness / pain with radiation into left arm with numbness / tingling that started at the same time her N/V/D did 3 - 4 weeks ago.  This has not changed since in character or frequency.  Denies lightheadedness, diaphoresis, syncope.     SUBJECTIVE:  NAD , remains hypotensive VITAL SIGNS: BP 95/53 mmHg  Pulse 75  Temp(Src) 98.7 F (37.1 C) (Oral)  Resp 20  Ht 5\' 4"  (1.626 m)  Wt 269 lb (122.018 kg)  BMI 46.15 kg/m2  SpO2 89%  LMP  (LMP Unknown)  HEMODYNAMICS:    VENTILATOR SETTINGS:    INTAKE / OUTPUT: I/O last  3 completed shifts: In: 3200 [I.V.:1890; IV Piggyback:1310] Out: 2000 [Urine:2000]   PHYSICAL EXAMINATION: General: Middle aged female, in NAD. Neuro: A&O x 3, non-focal.  HEENT: [place]/AT. PERRL, sclerae anicteric. Cardiovascular: RRR, no M/R/G.  Lungs: Respirations even and unlabored.  CTA bilaterally, No W/R/R. Abdomen: Morbidly obese.  BS x 4, soft, tender without guarding or rebound. Musculoskeletal: No gross deformities, no edema.  Skin: Intact, warm, no rashes.lower ext ecchymosis  LABS:  BMET  Recent Labs Lab 01/19/16 2253 01/20/16 0219  NA 138 140  K 5.1 5.0  CL 111 114*  CO2 11* 11*  BUN 175* 164*  CREATININE 7.58* 6.64*  GLUCOSE 72 70    Electrolytes  Recent Labs Lab 01/19/16 2253 01/20/16 0219  CALCIUM 7.5* 7.1*    CBC  Recent Labs Lab 01/19/16 2253  WBC 6.5  HGB 11.6*  HCT 35.7*  PLT 188    Coag's  Recent Labs Lab 01/19/16 2253  APTT 31  INR 1.35    Sepsis Markers  Recent Labs Lab 01/19/16 2253 01/19/16 2259 01/20/16 0219  LATICACIDVEN  --  0.9 0.5  PROCALCITON 0.78  --   --     ABG No results for input(s): PHART, PCO2ART, PO2ART in the last 168 hours.  Liver Enzymes  Recent Labs Lab 01/19/16 2253 01/20/16 4098  AST 11* 10*  ALT 13* 12*  ALKPHOS 52 48  BILITOT 0.9 0.9  ALBUMIN 3.2* 3.0*    Cardiac Enzymes No results for input(s): TROPONINI, PROBNP in the last 168 hours.  Glucose No results for input(s): GLUCAP in the last 168 hours.  Imaging [place] Renal  01/20/2016  CLINICAL DATA:  Acute renal failure; chronic CHF, circulatory shock, dehydration, 3 weeks of nausea vomiting and diarrhea. EXAM: RENAL / URINARY TRACT ULTRASOUND COMPLETE COMPARISON:  Abdominal CT scan of January 19, 2016 FINDINGS: Right Kidney: Length: 11.3 cm. Echogenicity is slightly increased as compared to the adjacent liver. No mass or hydronephrosis visualized. No stones observed. Left Kidney: Length: 12.1 cm. Echogenicity similar to that on the  right. No mass or hydronephrosis. No stones observed. Bladder: The urinary bladder is decompressed with a Foley catheter. IMPRESSION: No evidence of obstruction. The renal cortical echotexture is mildly increased as compared to the liver which could reflect medical renal disease. Electronically Signed   By: David  [place] M.D.   On: 01/20/2016 07:46   Dg Chest Port 1 View  01/20/2016  CLINICAL DATA:  Acute respiratory failure, shortness of breath, chronic CHF, acute renal failure. EXAM: PORTABLE CHEST 1 VIEW COMPARISON:  Portable chest x-ray of January 19, 2016 FINDINGS: There is chronic elevation of the right hemidiaphragm. The lungs are clear. There is mild stable prominence of the central pulmonary vascularity. There is stable cardiomegaly. There is no pulmonary edema. No significant pleural effusion is observed. IMPRESSION: Stable appearance of the chest since yesterday's study. Central pulmonary vascular prominence and mild cardiomegaly without pulmonary interstitial or alveolar edema. Electronically Signed   By: David  [place] M.D.   On: 01/20/2016 07:50     STUDIES:  CT A / P 7/10 > extensive diverticulosis of sigmoid without diverticulitis.  Suspect colitis of infectious or inflammatory nature of ascending colon and cecum.  Lymph nodes likely reactive.  3mm calculus within porta hepatis, suspicious for small stone within gallbladder neck or cystic duct.  No evidence of cholecystitis. 7/10 renal US negative  CULTURES: Stool GI PCR 7/10 > C.diff PCR 7/10 > Blood 7/10 >  ANTIBIOTICS: Cipro 7/10 > Flagyl 7/10 >   SIGNIFICANT EVENTS: 7/10 > [facility] with [facility]ension and acute renal failure.  LINES/TUBES: Left radial a line 7/10 >  DISCUSSION: 50 [facility]. F [facility] with N/V/D and abd pain x 3 -4 weeks, acute renal failure, hypotension. PCCM called for assistance evening of 7/10.  ASSESSMENT / PLAN:  CARDIOVASCULAR A:  Hypotension - suspect hypovolemic due to  decreased PO intake + N/V/D x 3 - 4 weeks.  Chest pain. Hx HLD, dCHF (no echo results available in system), chronic venous insufficiency. 7/11 1115 am developed bradycardia down to 30/ nausea. Ischemic in inferior leads.   P:  Fluid resusitation Consider CVL placement if no improvement. Assess EKG, troponin. Continue outpatient atorvastatin, aspirin. Hold outpatient lisinopril, spironolactone, furosemide. Solucortef for adrenal insuff 7/11 cards consult called. Trop ordered stat. Brady resolved without interventions.  RENAL Lab Results  Component Value Date   CREATININE 6.64* 01/20/2016   CREATININE 7.58* 01/19/2016    A:   AKI. AG + NAG metabolic acidosis - uremia + diarrhea. Hypocalcemia - corrects to 8.1 P:   Bicarb at 125 BMP in AM. Renal following  GASTROINTESTINAL A:   Colitis. Diverticulosis. Nutrition. P:   Abx per ID section. Follow stool cultures. NPO.  PULMONARY A: Acute hypoxic respiratory failure. OSA with probable OHS - on nocturnal  CPAP. P:   Continue supplemental O2 as needed to maintain SpO2 > 92%. Pulmonary hygiene. Continue nocturnal CPAP. CXR intermittently.  HEMATOLOGIC A:   VTE Prophylaxis. P:  SCD's / heparin. CBC in AM.  INFECTIOUS A:   Colitis. P:   Abx as above (Cipro / Flagyl).   ENDOCRINE A:   Hypothyroidism.   P:   Continue outpatient synthroid. Assess TSH.  NEUROLOGIC A:   No acute issues. P:   No interventions required.  Family updated: None available.  Interdisciplinary Family Meeting v Palliative Care Meeting:  Due by: 7/17.   Brett Canales Minor ACNP [facility] PCCM Pager [phone] till 3 pm If no answer page [phone] 01/20/2016, 9:16 AM   Attending note: Developed bradycardia, and chest pain this AM >> ECG with ischemic changes and mild elevation in troponin.  C/o nausea with dry heaves.  Alert, appears fatigued.  HR bradycardic/regular.  No wheeze.  Tender upper abdomen.  No edema.  Creatinine 4.93,  HCO3 11, Hb 12.1, WBC 6.5, Troponin 0.41  Assessment/plan:  AKI from volume depletion. - continue IV fluids  Chest pain, bradycardia. - appreciate input from cardiology  Colitis. - continue Abx  CC time by me independent of APP time 48 minutes.  Coralyn Helling, MD [facility] Pulmonary/Critical Care 01/20/2016, 2:02 PM Pager:  [phone] After 3pm call: [phone]

## 2016-01-20 NOTE — Progress Notes (Signed)
Paged ELink and Cards regarding persistent bradycardia

## 2016-01-20 NOTE — Progress Notes (Signed)
Pt c/o of chest pain and mouth burning HR decreased to 37 Dr Craige CottaSood CCM in to see Pt. Stat EKG obtained. Cardiology consulted. Pain resolved within 10 minutes. N/V x1

## 2016-01-20 NOTE — Progress Notes (Signed)
ANTICOAGULATION CONSULT NOTE - Initial Consult  Pharmacy Consult for heparin per pharmacy Indication: chest pain/ACS  Allergies  Allergen Reactions  . Penicillins Anaphylaxis    Patient Measurements: Height: 5\' 4"  (162.6 cm) Weight: 269 lb (122.018 kg) IBW/kg (Calculated) : 54.7 Heparin Dosing Weight: 85 kg  Vital Signs: Temp: 97.7 F (36.5 C) (07/11 1144) Temp Source: Oral (07/11 1144) BP: 92/48 mmHg (07/11 1000) Pulse Rate: 73 (07/11 1400)  Labs:  Recent Labs  01/19/16 2253 01/20/16 0219 01/20/16 1029  HGB 11.6*  --  12.1  HCT 35.7*  --  36.1  PLT 188  --  231  APTT 31  --   --   LABPROT 16.8*  --   --   INR 1.35  --   --   CREATININE 7.58* 6.64* 4.93*  TROPONINI  --   --  0.41*    Estimated Creatinine Clearance: 17.8 mL/min (by C-G formula based on Cr of 4.93).   Medical History: Past Medical History  Diagnosis Date  . Essential hypertension   . HLD (hyperlipidemia)   . Hypothyroidism   . Chronic CHF (congestive heart failure) (HCC)   . CKD (chronic kidney disease)   . Chronic venous insufficiency   . Yeast infection     Medications:  Infusions:  . phenylephrine (NEO-SYNEPHRINE) Adult infusion 40 mcg/min (01/20/16 1443)  .  sodium bicarbonate 150 mEq in sterile water 1000 mL infusion 125 mL/hr at 01/20/16 [id number]    Assessment: [age] yo female admitted with n/v/d. Stated she was having nausea and chest pain this morning. EKG showed ST depression. Pt was admitted with AKI, SCr is downtrending 7.5 > 4.9, eCrCl < 20 ml/min. CBC stable.   Goal of Therapy:  Heparin level 0.3-0.7 units/ml Monitor platelets by anticoagulation protocol: Yes    Plan:  -heparin 4000 units x1 then 1100 units/hr -daily HL, CBC, first level this evening  -f/u s/sx bleeding   Baldemar FridayMasters, Akiya Morr M 01/20/2016,2:43 PM

## 2016-01-20 NOTE — Consult Note (Signed)
Bonnie Krause Admit Date: 01/19/2016 01/20/2016 Arita Miss Requesting Physician:  Marchelle Gearing MD  Reason for Consult:  AKI HPI:  64F was admitted to [facility] for acute renal failure in the setting of nausea, vomiting, diarrhea. She lives in [place] and sees Dr. [name] for primary care. Pertinent past medical history includes CHF, hypertension (on ACE inhibitor, diuretic, MRB), obesity, OSA, and carries a diagnosis of CKD but baseline creatinine is unknown. She developed several weeks of nausea, vomiting, diarrhea which accelerated and she had poor oral intake but did continue to take blood pressure medicines as able. She presented to primary care yesterday where she was found to be hypotensive and then was seen at [facility] where her creatinine was elevated at 9.5 and she was hypotensive. She was transferred to [facility] and with persistent hypotension admitted to the ICU where she has been started on a phenylephrine drip and received aggressive hydration. Interestingly she has had urine outputs well above 1 would be expected. Labs are notable for a creatinine that has improved to 6.6 and a BUN of 164 (improved) this morning. Bicarbonate is 11 with an anion gap of 15. Potassium is acceptable at 5.0.  Cause of nausea/vomiting, diarrhea is unknown and being evaluated but has improved since admission to the ICU. She has been placed on ciprofloxacin and metronidazole. Renal ultrasound overnight demonstrated to normal size kidneys with some increased echogenicity but no hydronephrosis. No urinalysis to date.  She does endorse intermittent use of Aleve at home with her most recent dose on 7/9.  CREATININE, SER (mg/dL)  Date Value  16/04/9603 6.64*  01/19/2016 7.58*  ] I/Os:  ROS NSAIDS: used prior to admission IV Contrast no exposure TMP/SMX no exposure Hypotension present currently , on PE Balance of 12 systems is negative w/ exceptions as above  PMH  Past  Medical History  Diagnosis Date  . Essential hypertension   . HLD (hyperlipidemia)   . Hypothyroidism   . Chronic CHF (congestive heart failure) (HCC)   . CKD (chronic kidney disease)   . Chronic venous insufficiency   . Yeast infection    PSH  Past Surgical History  Procedure Laterality Date  . Knee surgery     FH  Family History  Problem Relation Age of Onset  . Diabetes Mother   . Heart disease Mother    SH  has no tobacco, alcohol, and drug history on file. Allergies  Allergies  Allergen Reactions  . Penicillins Anaphylaxis   Home medications Prior to Admission medications   Not on File    Current Medications Scheduled Meds: . aspirin EC  325 mg Oral Daily  . atorvastatin  40 mg Oral q1800  . ciprofloxacin  400 mg Intravenous QHS  . fluconazole  100 mg Oral Daily  . heparin  5,000 Units Subcutaneous Q8H  . hydrocortisone sod succinate (SOLU-CORTEF) inj  50 mg Intravenous Q6H  . levothyroxine  175 mcg Oral QAC breakfast  . metronidazole  500 mg Intravenous Q8H  . mometasone-formoterol  2 puff Inhalation BID   Continuous Infusions: . sodium chloride 125 mL/hr at 01/20/16 0602  . phenylephrine (NEO-SYNEPHRINE) Adult infusion     PRN Meds:.acetaminophen, albuterol, fentaNYL (SUBLIMAZE) injection, ondansetron (ZOFRAN) IV  CBC  Recent Labs Lab 01/19/16 2253  WBC 6.5  NEUTROABS 4.1  HGB 11.6*  HCT 35.7*  MCV 86.9  PLT 188   Basic Metabolic Panel  Recent Labs Lab 01/19/16 2253 01/20/16 0219  NA 138 140  K 5.1 5.0  CL 111 114*  CO2 11* 11*  GLUCOSE 72 70  BUN 175* 164*  CREATININE 7.58* 6.64*  CALCIUM 7.5* 7.1*    Physical Exam  Blood pressure 95/53, pulse 75, temperature 98.7 F (37.1 C), temperature source Oral, resp. rate 20, height 5\' 4"  (1.626 m), weight 122.018 kg (269 lb), SpO2 89 %. GEN: Obese, groggy but awake ENT: NCAT EYES: EOMI CV: RRR, nl s1s2 PULM: CTAB ant auscultation ABD: obese, soft SKIN: no rashes/lesions EXT:no  LEE   Assessment 55F with ?hx/o CKD admit with severe AKI, hypotension, hypovolemia, N/V/D for several weeks.  On ACEi+Diuretics+NSAIDs at home.  1. PreRenal AKI 2/2 hypovolemia 2/2 #3, BP meds 2. Hypotension, hypovolemic on PE gtt  3. N/V/D, present for weeks on Cipro/Flagyl; w/u pending 4. Metabolic acidosis likley 2/2 #1 and #3  Plan 1. Cont to hold BP meds 2. Change NS over to NaHCO3 gtt  3. BID BMP  Sabra Heckyan Kassidee Narciso MD [phone] pgr 01/20/2016, 8:48 AM

## 2016-01-20 NOTE — Progress Notes (Signed)
Pt admitted [facility] for nausea, vomiting and abdominal pain. During transportation, pt c/o chest pain which was resolved by fentanyl. Pt hypotensive at admission, alert and oriented x4, denies any pain, no respiratory distress. Admitting MD assessed pt, hypotension reported, RR nurses at the bedside, bolus given as ordered, no improvement in B/P. Transfer orders placed. Pt transferred to 65M at 0020, condition same as on admission except for pt slightly more lethargic.

## 2016-01-20 NOTE — Consult Note (Signed)
CARDIOLOGY CONSULT NOTE   Patient ID: Bonnie Krause MRN: [id number] DOB/AGE: 1966-06-30 50 y.o.  Admit date: 01/19/2016  Primary Physician   No primary care provider on file. Primary Cardiologist: New  Requesting MD: Dr. Marchelle Gearing Reason for Consultation: CHF, Chest pain  HPI: Bonnie Krause is a 50 year old female with a past medical history of HTN, HLD, CKD, and chronic diastolic CHF.   She presented to [facility] on 01/19/16 with complaints of chest pain, nausea, vomiting and diarrhea for 2 weeks. 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 Her pain also radiated to her jaw and throat. Pain lasted for about 15 minutes. She tells me that she has had chest pain every day for the past 2 weeks, it has waxed and waned in severity but has been constant. It is so severe at times that she cannot talk and reports profuse diaphoresis.   Upon arrival to the ED at [facility], her troponin was negative. EKG at [facility] showed diffuse ST depression and T wave inversion. Her creatinine was 9.5 and her potassium was 5.6.  Chest X ray showed borderline cardiomegaly and vascular congestion. Her BNP is 467.4. She was transferred to [facility] for further work up.   This morning, while in the ICU she was getting a bath and had an episode of bradycardia with rates in the 30's. At that time she had chest pain and had some nausea with emesis. EKG after this episode showed the same diffuse ST depression and T wave inversion especially in the inferior leads.   She has a history of diastolic CHF, she is followed by Dr. [name] at [facility]. She is on torsemide daily and spironolactone. She does not report any recent weight gain or increased edema, but she has been ill for 2 weeks experiencing  nausea, vomiting and diarrhea every day.    Past Medical History  Diagnosis Date  . Essential hypertension   . HLD (hyperlipidemia)   . Hypothyroidism   . Chronic CHF (congestive heart failure) (HCC)   . CKD (chronic kidney disease)   . Chronic venous insufficiency   . Yeast infection      Past Surgical History  Procedure Laterality Date  . Knee surgery      Allergies  Allergen Reactions  . Penicillins Anaphylaxis    I have reviewed the patient's current medications . aspirin EC  325 mg Oral Daily  . atorvastatin  40 mg Oral q1800  . atropine      . ciprofloxacin  400 mg Intravenous QHS  . fluconazole  100 mg Oral Daily  . heparin  5,000 Units Subcutaneous Q8H  . hydrocortisone sod succinate (SOLU-CORTEF) inj  50 mg Intravenous Q6H  . levothyroxine  175 mcg Oral QAC breakfast  . metronidazole  500 mg Intravenous Q8H  . mometasone-formoterol  2 puff Inhalation BID   . phenylephrine (NEO-SYNEPHRINE) Adult infusion 50 mcg/min (01/20/16 1125)  .  sodium bicarbonate 150 mEq in sterile water 1000 mL infusion 125 mL/hr at 01/20/16 0956   acetaminophen, albuterol, fentaNYL (SUBLIMAZE) injection, ondansetron (ZOFRAN) IV  Prior to Admission medications   Not on File     Social History   Social History  . Marital Status: Unknown    Spouse  Name: N/A  . Number of Children: N/A  . Years of Education: N/A   Occupational History  . Not on file.   Social History Main Topics  . Smoking status: Not on file  . Smokeless tobacco: Not on file  . Alcohol Use: Not on file  . Drug Use: Not on file  . Sexual Activity: Not on file   Other Topics Concern  . Not on file   Social History Narrative    No family status information on file.   Family History  Problem Relation Age of Onset  . Diabetes Mother   . Heart disease Mother      ROS:  Full 14 point review of systems complete and found to be negative unless listed above.  Physical Exam: Blood pressure 92/48, pulse  74, temperature 97.7 F (36.5 C), temperature source Oral, resp. rate 19, height  (1.626 m), weight 269 lb (122.018 kg), SpO2 96 %.  General: Well developed, well nourished,obese female in no acute distress Head: Eyes PERRLA, No xanthomas.   Normocephalic and atraumatic, oropharynx without edema or exudate.  Lungs: CTA  Heart: HRRR S1 S2, no rub/gallop, No murmur. Pulses are 2+ extrem.   Neck: No carotid bruits. No lymphadenopathy.  Unable to assess JVD. Abdomen: Bowel sounds present, abdomen soft and non-tender without masses or hernias noted. Msk:  No spine or cva tenderness. No weakness, no joint deformities or effusions. Extremities: No clubbing or cyanosis.  No edema.  Neuro: Alert and oriented X 3. No focal deficits noted. Psych:  Good affect, responds appropriately Skin: No rashes or lesions noted.  Labs:   Lab Results  Component Value Date   WBC 6.5 01/20/2016   HGB 12.1 01/20/2016   HCT 36.1 01/20/2016   MCV 85.5 01/20/2016   PLT 231 01/20/2016    Recent Labs  01/19/16 2253  INR 1.35    Recent Labs Lab 01/20/16 0219 01/20/16 1029  NA 140 141  K 5.0 5.0  CL 114* 116*  CO2 11* 11*  BUN 164* 150*  CREATININE 6.64* 4.93*  CALCIUM 7.1* 7.6*  PROT 6.1*  --   BILITOT 0.9  --   ALKPHOS 48  --   ALT 12*  --   AST 10*  --   GLUCOSE 70 85  ALBUMIN 3.0*  --    MAGNESIUM  Date Value Ref Range Status  01/20/2016 1.6* 1.7 - 2.4 mg/dL Final    Recent Labs  16/10/96 1029  TROPONINI 0.41*    B NATRIURETIC PEPTIDE  Date/Time Value Ref Range Status  01/19/2016 10:53 PM 467.4* 0.0 - 100.0 pg/mL Final   Lab Results  Component Value Date   CHOL 85 01/20/2016   HDL 17* 01/20/2016   LDLCALC 38 01/20/2016   TRIG 149 01/20/2016   No results found for: DDIMER LIPASE  Date/Time Value Ref Range Status  01/20/2016 10:29 AM 66* 11 - 51 U/L Final   AMYLASE  Date/Time Value Ref Range Status  01/20/2016 10:29 AM 76 28 - 100 U/L Final   TSH  Date/Time Value  Ref Range Status  01/20/2016 05:05 AM 2.268 0.350 - 4.500 uIU/mL Final    Echo: pending   ECG: NSR, sinus brady diffuse ST depression  Radiology:  US Renal  01/20/2016  CLINICAL DATA:  Acute renal failure; chronic CHF, circulatory shock, dehydration, 3 weeks of nausea vomiting and diarrhea. EXAM: RENAL / URINARY TRACT ULTRASOUND COMPLETE COMPARISON:  Abdominal CT scan of January 19, 2016 FINDINGS: Right Kidney:  Length: 11.3 cm. Echogenicity is slightly increased as compared to the adjacent liver. No mass or hydronephrosis visualized. No stones observed. Left Kidney: Length: 12.1 cm. Echogenicity similar to that on the right. No mass or hydronephrosis. No stones observed. Bladder: The urinary bladder is decompressed with a Foley catheter. IMPRESSION: No evidence of obstruction. The renal cortical echotexture is mildly increased as compared to the liver which could reflect medical renal disease. Electronically Signed   By: David  SwazilandJordan M.D.   On: 01/20/2016 07:46   Dg Chest Port 1 View  01/20/2016  CLINICAL DATA:  Acute respiratory failure, shortness of breath, chronic CHF, acute renal failure. EXAM: PORTABLE CHEST 1 VIEW COMPARISON:  Portable chest x-ray of January 19, 2016 FINDINGS: There is chronic elevation of the right hemidiaphragm. The lungs are clear. There is mild stable prominence of the central pulmonary vascularity. There is stable cardiomegaly. There is no pulmonary edema. No significant pleural effusion is observed. IMPRESSION: Stable appearance of the chest since yesterday's study. Central pulmonary vascular prominence and mild cardiomegaly without pulmonary interstitial or alveolar edema. Electronically Signed   By: David  SwazilandJordan M.D.   On: 01/20/2016 07:50    ASSESSMENT AND PLAN:    Principal Problem:   Nausea, vomiting, and diarrhea Active Problems:   Chest pain   Essential hypertension   HLD (hyperlipidemia)   Hypothyroidism   Chronic CHF (congestive heart failure) (HCC)   Yeast  infection   Abdominal pain   Hypotension   OSA (obstructive sleep apnea)   Shock circulatory (HCC)   Acute renal failure (ARF) (HCC)   Hyperkalemia   1. Elevated troponin: Patient presents with 2 weeks of intermittent chest pain with radiation to left arm and jaw. This am had an episode of bradycardia with chest pain and emesis. EKG shows diffuse ST depression concerning for ischemia. Ideally, we would like to rule out CAD with heart catheterization, but her creatinine is still elevated at 4.93 and do not want to risk further kidney injury. We will place on heparin gtt and follow troponins. She would benefit from left heart cath once her AKI resolves. She does have risk factors for CAD, including obesity and HLD.   2. Chronic diastolic CHF: Echo pending. Will follow results. Obviously no need for diuresis now. She was on ACE and spironolactone outpatient.   3. HLD: LDL is 38, takes 40mg  Atorvastatin outpatient.   Signed: Little IshikawaErin E Taleyah Hillman, NP 01/20/2016 12:44 PM Pager [phone]  Co-Sign MD

## 2016-01-20 NOTE — Consult Note (Signed)
PULMONARY / CRITICAL CARE MEDICINE   Name: Bonnie Krause MRN: [id number] DOB: May 21, 1966    ADMISSION DATE:  01/19/2016 CONSULTATION DATE:  01/19/16  REFERRING MD:  Clyde [facility]  CHIEF COMPLAINT:  N/V/D, hypotension  HISTORY OF PRESENT ILLNESS:   Bonnie Krause is a 50 y.o. female with PMH as outlined below. She was seen by PCP 7/10 for N/V/D, abd pain, generalized weakness x 3 - 4 weeks.  At PCP office, she was found to be hypotensive so was sent to [facility] for further evaluation.  At [facility], she had BP in 70's and was found to have acute renal failure with SCr 9.50.  [facility] and management and was [facility].  PCCM was called later that evening for persistent hypotension with SBP in 70's - 80's.  Of note, she has remained awake with normal mental status since admission and state she occasionally has low BP but is unable to tell me what her pressures typically run.  She states N/V/D have been going on for roughly 3 - 4 weeks and have been associated with abd pain.  Diarrhea has been as many as 8 times per night, vomiting 2 - 3 times per day.  She has not tolerated any food as she has never been hungry.  Has been trying to drink as much water as possible, occasionally able to keep it down.  Has had intermittent subjective fevers and chills.  Denies any hematochezia or melena. Has had dull chest tightness / pain with radiation into left arm with numbness / tingling that started at the same time her N/V/D did 3 - 4 weeks ago.  This has not changed since in character or frequency.  Denies lightheadedness, diaphoresis, syncope.  PAST MEDICAL HISTORY :  She  has a past medical history of Essential hypertension; HLD (hyperlipidemia); Hypothyroidism; Chronic CHF (congestive heart failure) (HCC); CKD (chronic kidney disease); Chronic venous insufficiency; and Yeast infection.  PAST SURGICAL HISTORY: She  has no past surgical  history on file.  Allergies  Allergen Reactions  . Penicillins Anaphylaxis    No current facility-administered medications on file prior to encounter.   No current outpatient prescriptions on file prior to encounter.    FAMILY HISTORY:  Her has no family status information on file.   SOCIAL HISTORY: She    REVIEW OF SYSTEMS:   All negative; except for those that are bolded, which indicate positives.  Constitutional: weight loss, weight gain, night sweats, fevers, chills, fatigue, weakness.  HEENT: headaches, sore throat, sneezing, nasal congestion, post nasal drip, difficulty swallowing, tooth/dental problems, visual complaints, visual changes, ear aches. Neuro: difficulty with speech, weakness, numbness, ataxia. CV:  chest pain, orthopnea, PND, swelling in lower extremities, dizziness, palpitations, syncope.  Resp: cough, hemoptysis, dyspnea, wheezing. GI  heartburn, indigestion, abdominal pain, nausea, vomiting, diarrhea, constipation, change in bowel habits, loss of appetite, hematemesis, melena, hematochezia.  GU: dysuria, change in color of urine, urgency or frequency, flank pain, hematuria. MSK: joint pain or swelling, decreased range of motion. Psych: change in mood or affect, depression, anxiety, suicidal ideations, homicidal ideations. Skin: rash, itching, bruising.   SUBJECTIVE:  Chest pain remains unchanged in character / nature.  Denies any SOB.  Has persistent dull abd pain.  VITAL SIGNS: BP 70/48 mmHg  Pulse 84  Temp(Src) 97.4 F (36.3 C) (Oral)  Resp 15  Ht 5\' 4"  (1.626 m)  Wt 269 lb (122.018 kg)  BMI 46.15 kg/m2  SpO2 97%  HEMODYNAMICS:    VENTILATOR SETTINGS:    INTAKE / OUTPUT:     PHYSICAL EXAMINATION: General: Middle aged female, in NAD. Neuro: A&O x 3, non-focal.  HEENT: [place]/AT. PERRL, sclerae anicteric. Cardiovascular: RRR, no M/R/G.  Lungs: Respirations even and unlabored.  CTA bilaterally, No W/R/R. Abdomen: Morbidly obese.  BS x 4,  soft, tender without guarding or rebound. Musculoskeletal: No gross deformities, no edema.  Skin: Intact, warm, no rashes.  LABS:  BMET  Recent Labs Lab 01/19/16 2253  NA 138  K 5.1  CL 111  CO2 11*  BUN 175*  CREATININE 7.58*  GLUCOSE 72    Electrolytes  Recent Labs Lab 01/19/16 2253  CALCIUM 7.5*    CBC  Recent Labs Lab 01/19/16 2253  WBC 6.5  HGB 11.6*  HCT 35.7*  PLT 188    Coag's  Recent Labs Lab 01/19/16 2253  APTT 31  INR 1.35    Sepsis Markers  Recent Labs Lab 01/19/16 2253 01/19/16 2259  LATICACIDVEN  --  0.9  PROCALCITON 0.78  --     ABG No results for input(s): PHART, PCO2ART, PO2ART in the last 168 hours.  Liver Enzymes  Recent Labs Lab 01/19/16 2253  AST 11*  ALT 13*  ALKPHOS 52  BILITOT 0.9  ALBUMIN 3.2*    Cardiac Enzymes No results for input(s): TROPONINI, PROBNP in the last 168 hours.  Glucose No results for input(s): GLUCAP in the last 168 hours.  Imaging No results found.   STUDIES:  CT A / P 7/10 > extensive diverticulosis of sigmoid without diverticulitis.  Suspect colitis of infectious or inflammatory nature of ascending colon and cecum.  Lymph nodes likely reactive.  3mm calculus within porta hepatis, suspicious for small stone within gallbladder neck or cystic duct.  No evidence of cholecystitis.  CULTURES: Stool GI PCR 7/10 > C.diff PCR 7/10 > Blood 7/10 >  ANTIBIOTICS: Cipro 7/10 > Flagyl 7/10 >   SIGNIFICANT EVENTS: 7/10 > transferred to [facility] with hypotension and acute renal failure.  LINES/TUBES: Left radial a line 7/10 >  DISCUSSION: 50 y.o. F transferred from [place] with N/V/D and abd pain x 3 -4 weeks, acute renal failure, hypotension. PCCM called for assistance evening of 7/10.  ASSESSMENT / PLAN:  CARDIOVASCULAR A:  Hypotension - suspect hypovolemic due to decreased PO intake + N/V/D x 3 - 4 weeks.  Chest pain. Hx HLD, dCHF (no echo results available in system), chronic  venous insufficiency. P:  Additional 1L NS bolus, then MIVF @ 125. Consider CVL placement if no improvement. Assess EKG, troponin. Continue outpatient atorvastatin, aspirin. Hold outpatient lisinopril, spironolactone, furosemide.  RENAL A:   AKI. AG + NAG metabolic acidosis - uremia + diarrhea. Hypocalcemia - corrects to 8.1 P:   NS @ 125. Consider renal US if no improvement in renal function in AM after aggressive hydration. 1g Ca gluconate. BMP in AM.  GASTROINTESTINAL A:   Colitis. Diverticulosis. Nutrition. P:   Abx per ID section. Follow stool cultures. NPO.  PULMONARY A: Acute hypoxic respiratory failure. OSA with probable OHS - on nocturnal CPAP. P:   Continue supplemental O2 as needed to maintain SpO2 > 92%. Pulmonary hygiene. Continue nocturnal CPAP. CXR intermittently.  HEMATOLOGIC A:   VTE Prophylaxis. P:  SCD's / heparin. CBC in AM.  INFECTIOUS A:   Colitis. P:   Abx as above (Cipro / Flagyl).   ENDOCRINE A:   Hypothyroidism.   P:   Continue outpatient synthroid.  Assess TSH.  NEUROLOGIC A:   No acute issues. P:   No interventions required.  Family updated: None available.  Interdisciplinary Family Meeting v Palliative Care Meeting:  Due by: 7/17.  CC time: 35 minutes.   Rutherford Guys, [place] [facility] [place] [facility] Pager: [phone]  or [phone] 01/20/2016, 1:16 AM

## 2016-01-20 NOTE — Procedures (Signed)
Arterial Catheter Insertion Procedure Note [address] [place] Elaine Sauerwein [id number] [birth year]  Procedure: Insertion of Arterial Catheter  Indications: Blood pressure monitoring  Procedure Details Consent: Risks of procedure as well as the alternatives and risks of each were explained to the (patient/caregiver).  Consent for procedure obtained. Time Out: Verified patient identification, verified procedure, site/side was marked, verified correct patient position, special equipment/implants available, medications/allergies/relevent history reviewed, required imaging and test results available.  Performed  Maximum sterile technique was used including antiseptics, cap, gloves, gown, hand hygiene, mask and sheet. Skin prep: Chlorhexidine; local anesthetic administered 22 gauge catheter was inserted into left radial artery using the Seldinger technique.  Evaluation Blood flow good; BP tracing good. Complications: No apparent complications.   Elmer PickerClifton, Kyanna Mahrt D 01/20/2016

## 2016-01-20 NOTE — Progress Notes (Signed)
ANTICOAGULATION CONSULT NOTE Pharmacy Consult for heparin per pharmacy Indication: chest pain/ACS  Allergies  Allergen Reactions  . Penicillins Anaphylaxis    Patient Measurements: Height: 5\' 4"  (162.6 cm) Weight: 269 lb (122.018 kg) IBW/kg (Calculated) : 54.7 Heparin Dosing Weight: 85 kg  Vital Signs: Temp: 98.7 F (37.1 C) (07/11 1957) Temp Source: Oral (07/11 1957) BP: 93/53 mmHg (07/11 2006) Pulse Rate: 55 (07/11 2200)  Labs:  Recent Labs  01/19/16 2253 01/20/16 0219 01/20/16 1029 01/20/16 1620 01/20/16 2230 01/20/16 2300  HGB 11.6*  --  12.1  --   --   --   HCT 35.7*  --  36.1  --   --   --   PLT 188  --  231  --   --   --   APTT 31  --   --   --   --   --   LABPROT 16.8*  --   --   --   --   --   INR 1.35  --   --   --   --   --   HEPARINUNFRC  --   --   --   --   --  0.52  CREATININE 7.58* 6.64* 4.93* 4.25*  --   --   TROPONINI  --   --  0.41* 0.81* 0.63*  --     Estimated Creatinine Clearance: 20.6 mL/min (by C-G formula based on Cr of 4.25).  Assessment: 50 yo female with chest pain/elevated cardiac markers for heparin  Goal of Therapy:  Heparin level 0.3-0.7 units/ml Monitor platelets by anticoagulation protocol: Yes    Plan:  Continue Heparin at current rate  Follow-up am labs.   Eddie Candlebbott, Jassica Zazueta Vernon 01/20/2016,11:34 PM

## 2016-01-21 ENCOUNTER — Inpatient Hospital Stay (HOSPITAL_COMMUNITY): Payer: PRIVATE HEALTH INSURANCE

## 2016-01-21 DIAGNOSIS — I214 Non-ST elevation (NSTEMI) myocardial infarction: Secondary | ICD-10-CM

## 2016-01-21 DIAGNOSIS — N170 Acute kidney failure with tubular necrosis: Secondary | ICD-10-CM

## 2016-01-21 DIAGNOSIS — R001 Bradycardia, unspecified: Secondary | ICD-10-CM

## 2016-01-21 DIAGNOSIS — R079 Chest pain, unspecified: Secondary | ICD-10-CM

## 2016-01-21 DIAGNOSIS — R579 Shock, unspecified: Secondary | ICD-10-CM

## 2016-01-21 LAB — CK: CK TOTAL: 48 U/L (ref 38–234)

## 2016-01-21 LAB — URINALYSIS, ROUTINE W REFLEX MICROSCOPIC
BILIRUBIN URINE: NEGATIVE
Glucose, UA: NEGATIVE mg/dL
HGB URINE DIPSTICK: NEGATIVE
KETONES UR: 15 mg/dL — AB
Leukocytes, UA: NEGATIVE
Nitrite: NEGATIVE
PROTEIN: NEGATIVE mg/dL
Specific Gravity, Urine: 1.019 (ref 1.005–1.030)
pH: 5 (ref 5.0–8.0)

## 2016-01-21 LAB — CBC WITH DIFFERENTIAL/PLATELET
BASOS ABS: 0 10*3/uL (ref 0.0–0.1)
BASOS PCT: 0 %
Eosinophils Absolute: 0 10*3/uL (ref 0.0–0.7)
Eosinophils Relative: 0 %
HEMATOCRIT: 31.4 % — AB (ref 36.0–46.0)
HEMOGLOBIN: 10.9 g/dL — AB (ref 12.0–15.0)
LYMPHS PCT: 27 %
Lymphs Abs: 1 10*3/uL (ref 0.7–4.0)
MCH: 29 pg (ref 26.0–34.0)
MCHC: 34.7 g/dL (ref 30.0–36.0)
MCV: 83.5 fL (ref 78.0–100.0)
MONO ABS: 0.2 10*3/uL (ref 0.1–1.0)
Monocytes Relative: 5 %
NEUTROS ABS: 2.6 10*3/uL (ref 1.7–7.7)
NEUTROS PCT: 68 %
Platelets: 143 10*3/uL — ABNORMAL LOW (ref 150–400)
RBC: 3.76 MIL/uL — AB (ref 3.87–5.11)
RDW: 14.9 % (ref 11.5–15.5)
WBC: 3.8 10*3/uL — ABNORMAL LOW (ref 4.0–10.5)

## 2016-01-21 LAB — C-REACTIVE PROTEIN: CRP: 1.1 mg/dL — ABNORMAL HIGH (ref <1.0)

## 2016-01-21 LAB — ECHOCARDIOGRAM COMPLETE
HEIGHTINCHES: 64 in
WEIGHTICAEL: 4304 [oz_av]

## 2016-01-21 LAB — BASIC METABOLIC PANEL
ANION GAP: 12 (ref 5–15)
BUN: 111 mg/dL — ABNORMAL HIGH (ref 6–20)
CALCIUM: 7.8 mg/dL — AB (ref 8.9–10.3)
CO2: 21 mmol/L — ABNORMAL LOW (ref 22–32)
Chloride: 112 mmol/L — ABNORMAL HIGH (ref 101–111)
Creatinine, Ser: 2.98 mg/dL — ABNORMAL HIGH (ref 0.44–1.00)
GFR, EST AFRICAN AMERICAN: 20 mL/min — AB (ref >60)
GFR, EST NON AFRICAN AMERICAN: 17 mL/min — AB (ref >60)
GLUCOSE: 89 mg/dL (ref 65–99)
POTASSIUM: 3.5 mmol/L (ref 3.5–5.1)
Sodium: 145 mmol/L (ref 135–145)

## 2016-01-21 LAB — MAGNESIUM: Magnesium: 1.4 mg/dL — ABNORMAL LOW (ref 1.7–2.4)

## 2016-01-21 LAB — SEDIMENTATION RATE: SED RATE: 26 mm/h — AB (ref 0–22)

## 2016-01-21 LAB — HEPARIN LEVEL (UNFRACTIONATED): Heparin Unfractionated: 0.44 IU/mL (ref 0.30–0.70)

## 2016-01-21 LAB — PHOSPHORUS: PHOSPHORUS: 5.5 mg/dL — AB (ref 2.5–4.6)

## 2016-01-21 LAB — TROPONIN I: TROPONIN I: 0.33 ng/mL — AB (ref <0.03)

## 2016-01-21 LAB — UREA NITROGEN, URINE: Urea Nitrogen, Ur: 489 mg/dL

## 2016-01-21 MED ORDER — HYDROCORTISONE NA SUCCINATE PF 100 MG IJ SOLR
50.0000 mg | Freq: Two times a day (BID) | INTRAMUSCULAR | Status: DC
  Administered 2016-01-21 – 2016-01-24 (×5): 50 mg via INTRAVENOUS
  Filled 2016-01-21 (×6): qty 1

## 2016-01-21 MED ORDER — PANTOPRAZOLE SODIUM 40 MG PO TBEC
40.0000 mg | DELAYED_RELEASE_TABLET | Freq: Every day | ORAL | Status: DC
  Administered 2016-01-21 – 2016-01-31 (×11): 40 mg via ORAL
  Filled 2016-01-21 (×11): qty 1

## 2016-01-21 MED ORDER — ONDANSETRON HCL 4 MG/2ML IJ SOLN
4.0000 mg | Freq: Once | INTRAMUSCULAR | Status: AC
  Administered 2016-01-21: 4 mg via INTRAVENOUS
  Filled 2016-01-21: qty 2

## 2016-01-21 MED ORDER — DOPAMINE-DEXTROSE 3.2-5 MG/ML-% IV SOLN
2.5000 ug/kg/min | INTRAVENOUS | Status: DC
  Administered 2016-01-21: 2.5 ug/kg/min via INTRAVENOUS
  Filled 2016-01-21: qty 250

## 2016-01-21 MED ORDER — PERFLUTREN LIPID MICROSPHERE
1.0000 mL | INTRAVENOUS | Status: AC | PRN
  Administered 2016-01-21: 3 mL via INTRAVENOUS
  Filled 2016-01-21: qty 10

## 2016-01-21 MED FILL — Fentanyl Citrate Preservative Free (PF) Inj 100 MCG/2ML: INTRAMUSCULAR | Qty: 2 | Status: AC

## 2016-01-21 NOTE — Progress Notes (Signed)
ANTICOAGULATION CONSULT NOTE Pharmacy Consult for heparin per pharmacy Indication: chest pain/ACS  Allergies  Allergen Reactions  . Penicillins Anaphylaxis    Patient Measurements: Height: 5\' 4"  (162.6 cm) Weight: 269 lb (122.018 kg) IBW/kg (Calculated) : 54.7 Heparin Dosing Weight: 85 kg  Vital Signs: Temp: 97.9 F (36.6 C) (07/12 1134) Temp Source: Oral (07/12 1134) BP: 110/70 mmHg (07/12 1300) Pulse Rate: 35 (07/12 1300)  Labs:  Recent Labs  01/19/16 2253  01/20/16 1029 01/20/16 1620 01/20/16 2230 01/20/16 2300 01/21/16 0454 01/21/16 1220  HGB 11.6*  --  12.1  --   --   --  10.9*  --   HCT 35.7*  --  36.1  --   --   --  31.4*  --   PLT 188  --  231  --   --   --  143*  --   APTT 31  --   --   --   --   --   --   --   LABPROT 16.8*  --   --   --   --   --   --   --   INR 1.35  --   --   --   --   --   --   --   HEPARINUNFRC  --   --   --   --   --  0.52 0.44  --   CREATININE 7.58*  < > 4.93* 4.25*  --   --  2.98*  --   TROPONINI  --   < > 0.41* 0.81* 0.63*  --   --  0.33*  < > = values in this interval not displayed.  Estimated Creatinine Clearance: 29.4 mL/min (by C-G formula based on Cr of 2.98).   Assessment: 50 yo female admitted with n/v/d. Stated she was having nausea and chest pain this morning. EKG showed ST depression, trop elevated. Pt was admitted with AKI, SCr is downtrending 7.5 > 2.9, eCrCl < 30 ml/min. CBC stable.   Goal of Therapy:  Heparin level 0.3-0.7 units/ml Monitor platelets by anticoagulation protocol: Yes    Plan:  -Heparin at 1100 units/hr -Daily HL, CBC -F/u plans for cath   Baldemar FridayMasters, Lynk Marti M 01/21/2016,1:45 PM

## 2016-01-21 NOTE — Progress Notes (Signed)
RT NOTE:  Pt attempting CPAP. Complains of stuffy nose. Pt will call if she wants to come off.

## 2016-01-21 NOTE — Progress Notes (Signed)
Echocardiogram 2D Echocardiogram has been performed with definity.  Bonnie Krause 01/21/2016, 12:58 PM

## 2016-01-21 NOTE — Progress Notes (Signed)
PULMONARY / CRITICAL CARE MEDICINE   Name: Bonnie Krause MRN: [id number] DOB: Oct 16, 1965    ADMISSION DATE:  01/19/2016 CONSULTATION DATE:  01/19/16  REFERRING MD:  Clyde Lundborg [facility])  CHIEF COMPLAINT:  N/V/D, hypotension  HISTORY OF PRESENT ILLNESS:   Bonnie Krause is a 50 y.o. female with PMH as outlined below. She was seen by PCP 7/10 for N/V/D, abd pain, generalized weakness x 3 - 4 weeks.  At PCP office, she was found to be hypotensive so was sent to [facility] for further evaluation.  At [facility], she had BP in 70's and was found to have acute renal failure with SCr 9.50.  She was transferred to [facility] for further evaluation and management and was admitted by [facility] team.  PCCM was called later that evening for persistent hypotension with SBP in 70's - 80's.  Of note, she has remained awake with normal mental status since admission and state she occasionally has low BP but is unable to tell me what her pressures typically run.  She states N/V/D have been going on for roughly 3 - 4 weeks and have been associated with abd pain.  Diarrhea has been as many as 8 times per night, vomiting 2 - 3 times per day.  She has not tolerated any food as she has never been hungry.  Has been trying to drink as much water as possible, occasionally able to keep it down.  Has had intermittent subjective fevers and chills.  Denies any hematochezia or melena. Has had dull chest tightness / pain with radiation into left arm with numbness / tingling that started at the same time her N/V/D did 3 - 4 weeks ago.  This has not changed since in character or frequency.  Denies lightheadedness, diaphoresis, syncope.     SUBJECTIVE:  NAD , no pressors, creatine continues to improve VITAL SIGNS: BP 93/53 mmHg  Pulse 82  Temp(Src) 97.6 F (36.4 C) (Oral)  Resp 18  Ht  (1.626 m)  Wt 269 lb (122.018 kg)  BMI 46.15 kg/m2  SpO2 99%  LMP  (LMP Unknown)  HEMODYNAMICS:    VENTILATOR SETTINGS:     INTAKE / OUTPUT: I/O last 3 completed shifts: In: 6161.3 [I.V.:4341.3; Other:10; IV Piggyback:1810] Out: 7500 [Urine:7500]   PHYSICAL EXAMINATION: General: Middle aged female, in NAD. Feels better 7/12 Neuro: A&O x 3, non-focal.  HEENT: [place]/AT. PERRL, sclerae anicteric. Cardiovascular: RRR, no M/R/G.  Lungs: Respirations even and unlabored.  CTA bilaterally, No W/R/R. Tolerated nocturnal cpap. Abdomen: Morbidly obese.  BS x 4, soft, tender without guarding or rebound. Musculoskeletal: No gross deformities, no edema.  Skin: Intact, warm, no rashes.lower ext ecchymosis  LABS:  BMET  Recent Labs Lab 01/20/16 1029 01/20/16 1620 01/21/16 0454  NA 141 141 145  K 5.0 4.7 3.5  CL 116* 114* 112*  CO2 11* 13* 21*  BUN 150* 139* 111*  CREATININE 4.93* 4.25* 2.98*  GLUCOSE 85 90 89    Electrolytes  Recent Labs Lab 01/20/16 1029 01/20/16 1620 01/21/16 0454  CALCIUM 7.6* 7.8* 7.8*  MG 1.6*  --  1.4*  PHOS 8.2*  --  5.5*    CBC  Recent Labs Lab 01/19/16 2253 01/20/16 1029 01/21/16 0454  WBC 6.5 6.5 3.8*  HGB 11.6* 12.1 10.9*  HCT 35.7* 36.1 31.4*  PLT 188 231 143*    Coag's  Recent Labs Lab 01/19/16 2253  APTT 31  INR 1.35    Sepsis Markers  Recent Labs Lab 01/19/16  2253 01/19/16 2259 01/20/16 0219  LATICACIDVEN  --  0.9 0.5  PROCALCITON 0.78  --   --     ABG No results for input(s): PHART, PCO2ART, PO2ART in the last 168 hours.  Liver Enzymes  Recent Labs Lab 01/19/16 2253 01/20/16 0219  AST 11* 10*  ALT 13* 12*  ALKPHOS 52 48  BILITOT 0.9 0.9  ALBUMIN 3.2* 3.0*    Cardiac Enzymes  Recent Labs Lab 01/20/16 1029 01/20/16 1620 01/20/16 2230  TROPONINI 0.41* 0.81* 0.63*    Glucose No results for input(s): GLUCAP in the last 168 hours.  Imaging Dg Chest Port 1 View  01/21/2016  CLINICAL DATA:  Respiratory failure EXAM: PORTABLE CHEST 1 VIEW COMPARISON:  January 20, 2016 FINDINGS: There is stable mild elevation of the right  hemidiaphragm. There is no edema or consolidation. There is cardiomegaly. There is again noted a degree of pulmonary venous hypertension. No adenopathy evident. No bone lesions. IMPRESSION: Stable pulmonary vascular congestion. No frank edema or consolidation. Electronically Signed   By: Bretta BangWilliam  Woodruff III M.D.   On: 01/21/2016 07:17     STUDIES:  CT A / P 7/10 > extensive diverticulosis of sigmoid without diverticulitis.  Suspect colitis of infectious or inflammatory nature of ascending colon and cecum.  Lymph nodes likely reactive.  3mm calculus within porta hepatis, suspicious for small stone within gallbladder neck or cystic duct.  No evidence of cholecystitis. 7/10 renal US negative  CULTURES: Stool GI PCR 7/10 > C.diff PCR 7/10 > Blood 7/10 >  ANTIBIOTICS: Cipro 7/10 > Flagyl 7/10 >   SIGNIFICANT EVENTS: 7/10 > [facility] with [facility] and acute renal failure. 7/11 inferior ekg changes  LINES/TUBES: Left radial a line 7/10 >7/12  DISCUSSION: 50 y.o. F [place] with N/V/D and abd pain x 3 -4 weeks, acute renal failure, hypotension. PCCM called for assistance evening of 7/10. 7/12 creatine continues to improve. Will dc a line 7/12. May be ready to go to SDU 24-48 hours.  ASSESSMENT / PLAN:  CARDIOVASCULAR A:  Hypotension - suspect hypovolemic due to decreased PO intake + N/V/D x 3 - 4 weeks.  Chest pain. Hx HLD, dCHF (no echo results available in system), chronic venous insufficiency. 7/11 1115 am developed bradycardia down to 30/ nausea. Ischemic in inferior leads.   P:  Fluid resusitation Consider CVL placement if no improvement. Assess EKG, troponin. Cards is following, no invasive intervention due to elevated creatine Continue outpatient atorvastatin, aspirin. Hold outpatient lisinopril, spironolactone, furosemide. Solucortef for adrenal insuff, start weaning 7/12 7/11 cards following  RENAL Lab Results  Component Value Date    CREATININE 2.98* 01/21/2016   CREATININE 4.25* 01/20/2016   CREATININE 4.93* 01/20/2016    A:   AKI. AG + NAG metabolic acidosis - uremia + diarrhea. Gap closed  Hypocalcemia  P:   Bicarb at 125 per renal  Follow lytes Renal following  GASTROINTESTINAL A:   Colitis. Diverticulosis. Nutrition. P:   Abx per ID section. Follow stool cultures. NPO.  PULMONARY A: Acute hypoxic respiratory failure. OSA with probable OHS - on nocturnal CPAP. P:   Continue supplemental O2 as needed to maintain SpO2 > 92%. Pulmonary hygiene. Continue nocturnal CPAP. CXR intermittently.  HEMATOLOGIC A:   VTE Prophylaxis. P:  SCD's / heparin. CBC in AM.  INFECTIOUS A:   Colitis. P:   Abx as above (Cipro / Flagyl).   ENDOCRINE A:   Hypothyroidism.    TSH.2.2 P:   Continue outpatient synthroid.  NEUROLOGIC A:   No acute issues. P:   No interventions required.  Family updated: None available.  Interdisciplinary Family Meeting v Palliative Care Meeting:  Due by: 7/17.   Brett Canales Minor ACNP [facility] PCCM Pager [phone] till 3 pm If no answer page [phone] 01/21/2016, 9:22 AM  Attending note: Still has nausea, but better.  Chest pain improved.  Alert. Bradycardic.  No wheeze.  Abd soft.  No edema.  Creatinine 2.98, HCO3 21, Hb 10.9.  Assessment/plan:  AKI in setting of volume depletion >> improving. - continue IV fluids with HCO3  Bradycardia with ischemic changes on ECG. - dopamine added by cardiology - continue heparin gtt - will likely need LHC when renal fx better - f/u Echo  Septic/hypovolemic shock. Colitis. - pressors to keep MAP > 65 - continue solu cortef for now - continue Abx  Updated pt's family at bedside.  CC time by me independent of APP time 31 minutes.  Coralyn Helling, MD [facility] Pulmonary/Critical Care 01/21/2016, 12:55 PM Pager:  [phone] After 3pm call: [phone]

## 2016-01-21 NOTE — Progress Notes (Signed)
Echo results reveal markedly elevated PA pressure of 114 mmHg. Notified primary team. Patient is on heparin gtt. Currently.

## 2016-01-21 NOTE — Progress Notes (Signed)
RT NOTE:  Pt didn't wear CPAP last night. Pt stated "I don't know right now" when asked about tonight. RT will check back at later time.

## 2016-01-21 NOTE — Progress Notes (Addendum)
[facility] PCCM PROGRESS NOTE  S: Contacted by cardiology NP with results of echocardiogram, which noted significant R sided heart strain and PA pressure 114. To bedside to evaluate patient who endorsed symptoms of primarily N/V/D. She did note some SOB and dull chest pain. SOB is much improved now and she is hemodynamically stable. Reports breathing at about baseline. She does have a family history of autoimmune disease.  O: BP 100/60 mmHg  Pulse 82  Temp(Src) 97.3 F (36.3 C) (Oral)  Resp 19  Ht '5\' 4"'$  (1.626 m)  Wt 122.018 kg (269 lb)  BMI 46.15 kg/m2  SpO2 95%  LMP  (LMP Unknown)  General:  Obese female in mild resp distress Neuro:  Alert, oriented, non-focal HEENT:  [place]/AT, PERRL, difficult to assess JVD Cardiovascular:  RRR, no MRG Lungs: clear, mildly labored Abdomen:  Soft, non-tender, non-distended Musculoskeletal:  No acute deformity Skin:  Intact, MMM   A/P: Severe Pulmonary Hypertension > PA pressures estimated to be 114 by Echo. ddx includes pulmonary embolism, autoimmune disease, and progressive decompensated CHF, OSA.   - Doppler lower extremities - V/Q scan - Continue heparin gtt - Autoimmune workup (CK, ESR, CRP, ANA, ANCA, Anti-scl, ACA, Anti-CCP, Anti-smith, RF) - Will need to compare old echo from Dr. Joya Gaskins office in AM - Continue nocturnal CPAP - Would ultimately need RHC to confirm.  Georgann Housekeeper, ACNP [facility] Pulmonology/Critical Care Pager [phone] or [phone]

## 2016-01-21 NOTE — Progress Notes (Signed)
Patient Name: Bonnie Krause Date of Encounter: 01/21/2016  Principal Problem:   Nausea, vomiting, and diarrhea Active Problems:   Chest pain   Essential hypertension   HLD (hyperlipidemia)   Hypothyroidism   Chronic CHF (congestive heart failure) (HCC)   Yeast infection   Abdominal pain   Hypotension   OSA (obstructive sleep apnea)   Shock circulatory (HCC)   Acute renal failure (ARF) (HCC)   Hyperkalemia   Bradycardia   Elevated troponin   EKG abnormalities    Patient Profile: Bonnie Krause is a 62F with hypertension, hyperlipidemia, obesity, chronic diastolic heart failure, and CKD admitted with 2 weeks of diarrhea and found to have acute renal failure.Troponin was mildly elevated to 0.41 and she also reports intermittent episodes of chest pain. It is unclear whether the bradycardia was a vagal reaction due to nausea or whether there may be RCA stenosis.   SUBJECTIVE: Feels weak. Denies chest pain over night, endorses orthopnea.    OBJECTIVE Filed Vitals:   01/21/16 0700 01/21/16 0800 01/21/16 0803 01/21/16 0900  BP:      Pulse: 44 62  82  Temp:   97.6 F (36.4 C)   TempSrc:   Oral   Resp: 16 17  18   Height:      Weight:      SpO2: 92% 97%  99%    Intake/Output Summary (Last 24 hours) at 01/21/16 0918 Last data filed at 01/21/16 0900  Gross per 24 hour  Intake 2993.25 ml  Output   5100 ml  Net -2106.75 ml   Filed Weights   01/20/16 0100  Weight: 269 lb (122.018 kg)    PHYSICAL EXAM General: Well developed, well nourished, obese female in no acute distress. Head: Normocephalic, atraumatic.  Neck: Supple without bruits, cannot assess JVD. Lungs:  Resp regular and unlabored, CTA. Heart: RRR, S1, S2, no S3, S4, or murmur; no rub. Abdomen: Soft, non-tender, non-distended, BS + x 4.  Extremities: No clubbing, cyanosis,no edema.  Neuro: Alert and oriented X 3. Moves all extremities spontaneously. Psych: Normal affect.  LABS: CBC: Recent Labs  01/20/16 1029 01/21/16 0454  WBC 6.5 3.8*  NEUTROABS 5.2 2.6  HGB 12.1 10.9*  HCT 36.1 31.4*  MCV 85.5 83.5  PLT 231 143*   INR: Recent Labs  01/19/16 2253  INR 1.35   Basic Metabolic Panel: Recent Labs  01/20/16 1029 01/20/16 1620 01/21/16 0454  NA 141 141 145  K 5.0 4.7 3.5  CL 116* 114* 112*  CO2 11* 13* 21*  GLUCOSE 85 90 89  BUN 150* 139* 111*  CREATININE 4.93* 4.25* 2.98*  CALCIUM 7.6* 7.8* 7.8*  MG 1.6*  --  1.4*  PHOS 8.2*  --  5.5*   Liver Function Tests: Recent Labs  01/19/16 2253 01/20/16 0219  AST 11* 10*  ALT 13* 12*  ALKPHOS 52 48  BILITOT 0.9 0.9  PROT 6.5 6.1*  ALBUMIN 3.2* 3.0*   Cardiac Enzymes: Recent Labs  01/20/16 1029 01/20/16 1620 01/20/16 2230  TROPONINI 0.41* 0.81* 0.63*   BNP:  B NATRIURETIC PEPTIDE  Date/Time Value Ref Range Status  01/19/2016 10:53 PM 467.4* 0.0 - 100.0 pg/mL Final   Hemoglobin A1C: Recent Labs  01/20/16 0505  HGBA1C 6.1*   Fasting Lipid Panel: Recent Labs  01/20/16 0505  CHOL 85  HDL 17*  LDLCALC 38  TRIG [id number]  CHOLHDL 5.0   Thyroid Function Tests: Recent Labs  01/20/16 0505  TSH 2.268  Current facility-administered medications:  .  acetaminophen (TYLENOL) tablet 650 mg, 650 mg, Oral, Q4H PRN, Lorretta Harp, MD .  albuterol (PROVENTIL) (2.5 MG/3ML) 0.083% nebulizer solution 2.5 mg, 2.5 mg, Nebulization, Q4H PRN, Lorretta Harp, MD .  aspirin EC tablet 325 mg, 325 mg, Oral, Daily, Lorretta Harp, MD, 325 mg at 01/20/16 1040 .  atorvastatin (LIPITOR) tablet 40 mg, 40 mg, Oral, q1800, Lorretta Harp, MD, 40 mg at 01/20/16 0039 .  ciprofloxacin (CIPRO) IVPB 400 mg, 400 mg, Intravenous, QHS, Lorretta Harp, MD, 400 mg at 01/20/16 2215 .  fentaNYL (SUBLIMAZE) injection 25-50 mcg, 25-50 mcg, Intravenous, Q2H PRN, Rahul P Desai, PA-C .  fluconazole (DIFLUCAN) tablet 100 mg, 100 mg, Oral, Daily, Lorretta Harp, MD, 100 mg at 01/20/16 1041 .  heparin ADULT infusion 100 units/mL (25000 units/210mL sodium chloride  0.45%), 1,100 Units/hr, Intravenous, Continuous, Darl Householder Masters, RPH, Last Rate: 11 mL/hr at 01/21/16 0800, 1,100 Units/hr at 01/21/16 0800 .  hydrocortisone sodium succinate (SOLU-CORTEF) 100 MG injection 50 mg, 50 mg, Intravenous, Q6H, Kalman Shan, MD, 50 mg at 01/21/16 0500 .  levothyroxine (SYNTHROID, LEVOTHROID) tablet 175 mcg, 175 mcg, Oral, QAC breakfast, Lorretta Harp, MD, 175 mcg at 01/21/16 0820 .  metroNIDAZOLE (FLAGYL) IVPB 500 mg, 500 mg, Intravenous, Q8H, Lorretta Harp, MD, 500 mg at 01/21/16 0530 .  mometasone-formoterol (DULERA) 200-5 MCG/ACT inhaler 2 puff, 2 puff, Inhalation, BID, Lorretta Harp, MD, 2 puff at 01/19/16 2328 .  ondansetron (ZOFRAN) injection 4 mg, 4 mg, Intravenous, Q6H PRN, Lorretta Harp, MD, 4 mg at 01/20/16 2302 .  phenylephrine (NEO-SYNEPHRINE) 10 mg in dextrose 5 % 250 mL (0.04 mg/mL) infusion, 30-200 mcg/min, Intravenous, Continuous, Kalman Shan, MD, Stopped at 01/20/16 1750 .  sodium bicarbonate 150 mEq in sterile water 1,000 mL infusion, , Intravenous, Continuous, Arita Miss, MD, Last Rate: 75 mL/hr at 01/21/16 0822 . heparin 1,100 Units/hr (01/21/16 0800)  . phenylephrine (NEO-SYNEPHRINE) Adult infusion Stopped (01/20/16 1750)  .  sodium bicarbonate 150 mEq in sterile water 1000 mL infusion 75 mL/hr at 01/21/16 1610   TELE: NSR to sinus brady, rates in 40's at times      Radiology/Studies: US Renal  01/20/2016  CLINICAL DATA:  Acute renal failure; chronic CHF, circulatory shock, dehydration, 3 weeks of nausea vomiting and diarrhea. EXAM: RENAL / URINARY TRACT ULTRASOUND COMPLETE COMPARISON:  Abdominal CT scan of January 19, 2016 FINDINGS: Right Kidney: Length: 11.3 cm. Echogenicity is slightly increased as compared to the adjacent liver. No mass or hydronephrosis visualized. No stones observed. Left Kidney: Length: 12.1 cm. Echogenicity similar to that on the right. No mass or hydronephrosis. No stones observed. Bladder: The urinary bladder is decompressed with  a Foley catheter. IMPRESSION: No evidence of obstruction. The renal cortical echotexture is mildly increased as compared to the liver which could reflect medical renal disease. Electronically Signed   By: David  [place] M.D.   On: 01/20/2016 07:46   Dg Chest Port 1 View  01/21/2016  CLINICAL DATA:  Respiratory failure EXAM: PORTABLE CHEST 1 VIEW COMPARISON:  January 20, 2016 FINDINGS: There is stable mild elevation of the right hemidiaphragm. There is no edema or consolidation. There is cardiomegaly. There is again noted a degree of pulmonary venous hypertension. No adenopathy evident. No bone lesions. IMPRESSION: Stable pulmonary vascular congestion. No frank edema or consolidation. Electronically Signed   By: Bretta Bang III M.D.   On: 01/21/2016 07:17   Dg Chest Port 1 View  01/20/2016  CLINICAL DATA:  Acute respiratory  failure, shortness of breath, chronic CHF, acute renal failure. EXAM: PORTABLE CHEST 1 VIEW COMPARISON:  Portable chest x-ray of January 19, 2016 FINDINGS: There is chronic elevation of the right hemidiaphragm. The lungs are clear. There is mild stable prominence of the central pulmonary vascularity. There is stable cardiomegaly. There is no pulmonary edema. No significant pleural effusion is observed. IMPRESSION: Stable appearance of the chest since yesterday's study. Central pulmonary vascular prominence and mild cardiomegaly without pulmonary interstitial or alveolar edema. Electronically Signed   By: David  [place] M.D.   On: 01/20/2016 07:50     Current Medications:  . aspirin EC  325 mg Oral Daily  . atorvastatin  40 mg Oral q1800  . ciprofloxacin  400 mg Intravenous QHS  . fluconazole  100 mg Oral Daily  . hydrocortisone sod succinate (SOLU-CORTEF) inj  50 mg Intravenous Q6H  . levothyroxine  175 mcg Oral QAC breakfast  . metronidazole  500 mg Intravenous Q8H  . mometasone-formoterol  2 puff Inhalation BID   . heparin 1,100 Units/hr (01/21/16 0800)  . phenylephrine  (NEO-SYNEPHRINE) Adult infusion Stopped (01/20/16 1750)  .  sodium bicarbonate 150 mEq in sterile water 1000 mL infusion 75 mL/hr at 01/21/16 1610    ASSESSMENT AND PLAN: Principal Problem:   Nausea, vomiting, and diarrhea Active Problems:   Chest pain   Essential hypertension   HLD (hyperlipidemia)   Hypothyroidism   Chronic CHF (congestive heart failure) (HCC)   Yeast infection   Abdominal pain   Hypotension   OSA (obstructive sleep apnea)   Shock circulatory (HCC)   Acute renal failure (ARF) (HCC)   Hyperkalemia   Bradycardia   Elevated troponin   EKG abnormalities  1. Elevated troponin: Patient presents with 2 weeks of intermittent chest pain with radiation to left arm and jaw. Yesterday she had an episode of bradycardia (rates dropped to 30's), with chest pain and emesis. EKG shows diffuse ST depression concerning for ischemia. Ideally, we would like to rule out CAD with heart catheterization, but her creatinine is still elevated at 2.98, which is improved from her initial presentation with Cr of 9.5.She will need left heart cath when her acute kidney injury normalizes.   2. Chronic diastolic CHF: Echo pending. Will follow results. Obviously no need for diuresis now. She was on ACE and spironolactone outpatient.   3. HLD: LDL is 38, takes  Atorvastatin outpatient.   Signed, Little Ishikawa , NP 9:18 AM 01/21/2016 Pager [phone]

## 2016-01-21 NOTE — Consult Note (Signed)
Admit: 01/19/2016 LOS: 2  Bonnie Krause with ?hx/o CKD admit with severe AKI, hypotension, hypovolemia, N/V/D for several weeks. On ACEi+Diuretics+NSAIDs at home.  Subjective:  BUN/SCr trending down Having some bradycardia, positive Troponin on hep gtt Off PE gtt Still on NaHCO3 gtt, serum HCO3 improved UOP>5L    07/11 0701 - 07/12 0700 In: 2815.3 [I.V.:2315.3; IV Piggyback:500] Out: 5500 [Urine:5500]  Filed Weights   01/20/16 0100  Weight: 122.018 kg (269 lb)    Scheduled Meds: . aspirin EC  325 mg Oral Daily  . atorvastatin  40 mg Oral q1800  . ciprofloxacin  400 mg Intravenous QHS  . fluconazole  100 mg Oral Daily  . hydrocortisone sod succinate (SOLU-CORTEF) inj  50 mg Intravenous Q6H  . levothyroxine  175 mcg Oral QAC breakfast  . metronidazole  500 mg Intravenous Q8H  . mometasone-formoterol  2 puff Inhalation BID   Continuous Infusions: . heparin 1,100 Units (01/20/16 1900)  . phenylephrine (NEO-SYNEPHRINE) Adult infusion Stopped (01/20/16 1750)  .  sodium bicarbonate 150 mEq in sterile water 1000 mL infusion 125 mL/hr at 01/21/16 0302   PRN Meds:.acetaminophen, albuterol, fentaNYL (SUBLIMAZE) injection, ondansetron (ZOFRAN) IV  Current Labs: reviewed    Physical Exam:  Blood pressure 93/53, pulse 74, temperature 97.6 F (36.4 C), temperature source Oral, resp. rate 18, height 5\' 4"  (1.626 m), weight 122.018 kg (269 lb), SpO2 100 %. GEN: Obese, groggy but awake ENT: NCAT EYES: EOMI CV: RRR, nl s1s2 PULM: CTAB ant auscultation ABD: obese, soft SKIN: no rashes/lesions EXT:no LEE  7/11 Renal US: no HN/obstruction  Assessment  1. PreRenal AKI 2/2 hypovolemia 2/2 #3, BP meds; improving 2. Hypotension, hypovolemic on PE gtt  3. N/V/D, present for weeks on Cipro/Flagyl; w/u pending 4. Metabolic acidosis likley 2/2 #1 and #3 5. Sinus Bradycardia, Positie Troponine -- Cardiology seeing 6. HTN at home on ACEi, diuretics  Plan 1. Cont to hold BP meds 2. Reduce  NaHCO3 gtt 3. Check UA 4. Daily weights, Daily Renal Panel, Strict I/Os, Avoid nephrotoxins (NSAIDs, judicious IV Contrast)   Sabra Heckyan Gray Doering MD 01/21/2016, 8:17 AM   Recent Labs Lab 01/20/16 1029 01/20/16 1620 01/21/16 0454  NA 141 141 145  K 5.0 4.7 3.5  CL 116* 114* 112*  CO2 11* 13* 21*  GLUCOSE 85 90 89  BUN 150* 139* 111*  CREATININE 4.93* 4.25* 2.98*  CALCIUM 7.6* 7.8* 7.8*  PHOS 8.2*  --  5.5*    Recent Labs Lab 01/19/16 2253 01/20/16 1029 01/21/16 0454  WBC 6.5 6.5 3.8*  NEUTROABS 4.1 5.2 2.6  HGB 11.6* 12.1 10.9*  HCT 35.7* 36.1 31.4*  MCV 86.9 85.5 83.5  PLT 188 231 143*

## 2016-01-22 ENCOUNTER — Inpatient Hospital Stay (HOSPITAL_COMMUNITY): Payer: PRIVATE HEALTH INSURANCE

## 2016-01-22 ENCOUNTER — Encounter (HOSPITAL_COMMUNITY)
Admission: AD | Disposition: A | Discharge status: Home Health | Payer: Self-pay | Source: Other Acute Inpatient Hospital | Attending: Internal Medicine

## 2016-01-22 DIAGNOSIS — R609 Edema, unspecified: Secondary | ICD-10-CM

## 2016-01-22 DIAGNOSIS — I272 Pulmonary hypertension, unspecified: Secondary | ICD-10-CM | POA: Insufficient documentation

## 2016-01-22 LAB — BASIC METABOLIC PANEL
ANION GAP: 9 (ref 5–15)
ANION GAP: 11 (ref 5–15)
BUN: 71 mg/dL — ABNORMAL HIGH (ref 6–20)
BUN: 67 mg/dL — ABNORMAL HIGH (ref 6–20)
CALCIUM: 8 mg/dL — AB (ref 8.9–10.3)
CHLORIDE: 107 mmol/L (ref 101–111)
CO2: 30 mmol/L (ref 22–32)
CO2: 30 mmol/L (ref 22–32)
Calcium: 8 mg/dL — ABNORMAL LOW (ref 8.9–10.3)
Chloride: 106 mmol/L (ref 101–111)
Creatinine, Ser: 1.91 mg/dL — ABNORMAL HIGH (ref 0.44–1.00)
Creatinine, Ser: 1.72 mg/dL — ABNORMAL HIGH (ref 0.44–1.00)
GFR calc Af Amer: 34 mL/min — ABNORMAL LOW (ref >60)
GFR, EST AFRICAN AMERICAN: 39 mL/min — AB (ref >60)
GFR, EST NON AFRICAN AMERICAN: 30 mL/min — AB (ref >60)
GFR, EST NON AFRICAN AMERICAN: 34 mL/min — AB (ref >60)
GLUCOSE: 104 mg/dL — AB (ref 65–99)
GLUCOSE: 92 mg/dL (ref 65–99)
POTASSIUM: 3.3 mmol/L — AB (ref 3.5–5.1)
POTASSIUM: 3.5 mmol/L (ref 3.5–5.1)
Sodium: 146 mmol/L — ABNORMAL HIGH (ref 135–145)
Sodium: 147 mmol/L — ABNORMAL HIGH (ref 135–145)

## 2016-01-22 LAB — CBC WITH DIFFERENTIAL/PLATELET
BASOS ABS: 0 10*3/uL (ref 0.0–0.1)
Basophils Relative: 0 %
EOS ABS: 0 10*3/uL (ref 0.0–0.7)
EOS PCT: 1 %
HCT: 34.5 % — ABNORMAL LOW (ref 36.0–46.0)
HEMOGLOBIN: 11.2 g/dL — AB (ref 12.0–15.0)
LYMPHS ABS: 4.3 10*3/uL — AB (ref 0.7–4.0)
LYMPHS PCT: 49 %
MCH: 28.3 pg (ref 26.0–34.0)
MCHC: 32.5 g/dL (ref 30.0–36.0)
MCV: 87.1 fL (ref 78.0–100.0)
Monocytes Absolute: 0.7 10*3/uL (ref 0.1–1.0)
Monocytes Relative: 9 %
NEUTROS PCT: 41 %
Neutro Abs: 3.5 10*3/uL (ref 1.7–7.7)
PLATELETS: 166 10*3/uL (ref 150–400)
RBC: 3.96 MIL/uL (ref 3.87–5.11)
RDW: 15.2 % (ref 11.5–15.5)
WBC: 8.6 10*3/uL (ref 4.0–10.5)

## 2016-01-22 LAB — TROPONIN I
TROPONIN I: 1.28 ng/mL — AB (ref <0.03)
Troponin I: 0.44 ng/mL (ref <0.03)
Troponin I: 0.71 ng/mL (ref <0.03)
Troponin I: 0.78 ng/mL (ref <0.03)

## 2016-01-22 LAB — PHOSPHORUS: Phosphorus: 2.7 mg/dL (ref 2.5–4.6)

## 2016-01-22 LAB — HEPARIN LEVEL (UNFRACTIONATED): HEPARIN UNFRACTIONATED: 0.4 [IU]/mL (ref 0.30–0.70)

## 2016-01-22 LAB — MAGNESIUM
MAGNESIUM: 1.4 mg/dL — AB (ref 1.7–2.4)
MAGNESIUM: 1.9 mg/dL (ref 1.7–2.4)

## 2016-01-22 SURGERY — CANCELLED PROCEDURE

## 2016-01-22 MED ORDER — TECHNETIUM TC 99M DIETHYLENETRIAME-PENTAACETIC ACID: 31.5000 | Freq: Once | INTRAVENOUS | Status: DC | PRN

## 2016-01-22 MED ORDER — CIPROFLOXACIN IN D5W 400 MG/200ML IV SOLN
400.0000 mg | Freq: Two times a day (BID) | INTRAVENOUS | Status: DC
  Administered 2016-01-22: 400 mg via INTRAVENOUS
  Filled 2016-01-22 (×2): qty 200

## 2016-01-22 MED ORDER — MAGNESIUM SULFATE 50 % IJ SOLN
3.0000 g | Freq: Once | INTRAVENOUS | Status: AC
  Administered 2016-01-22: 3 g via INTRAVENOUS
  Filled 2016-01-22: qty 6

## 2016-01-22 MED ORDER — TECHNETIUM TO 99M ALBUMIN AGGREGATED
4.3000 | Freq: Once | INTRAVENOUS | Status: AC | PRN
  Administered 2016-01-22: 4 via INTRAVENOUS

## 2016-01-22 MED ORDER — SODIUM CHLORIDE 0.9 % IV SOLN
INTRAVENOUS | Status: DC
  Administered 2016-01-22 – 2016-01-23 (×3): via INTRAVENOUS

## 2016-01-22 MED ORDER — POTASSIUM CHLORIDE 10 MEQ/100ML IV SOLN
10.0000 meq | INTRAVENOUS | Status: AC
  Administered 2016-01-22 (×6): 10 meq via INTRAVENOUS
  Filled 2016-01-22: qty 100

## 2016-01-22 MED ORDER — VERAPAMIL HCL 2.5 MG/ML IV SOLN
INTRAVENOUS | Status: AC
  Filled 2016-01-22: qty 2

## 2016-01-22 MED ORDER — PROMETHAZINE HCL 25 MG/ML IJ SOLN
12.5000 mg | INTRAMUSCULAR | Status: DC | PRN
  Administered 2016-01-22 – 2016-01-31 (×3): 12.5 mg via INTRAVENOUS
  Filled 2016-01-22 (×3): qty 1

## 2016-01-22 MED ORDER — LIDOCAINE HCL (PF) 1 % IJ SOLN
INTRAMUSCULAR | Status: AC
  Filled 2016-01-22: qty 30

## 2016-01-22 MED ORDER — NITROGLYCERIN 0.4 MG SL SUBL
0.4000 mg | SUBLINGUAL_TABLET | SUBLINGUAL | Status: DC | PRN
  Administered 2016-01-22: 0.4 mg via SUBLINGUAL
  Filled 2016-01-22: qty 1

## 2016-01-22 NOTE — Progress Notes (Signed)
eLink Physician-Brief Progress Note Patient Name: Lane Hackeraula Elaine Gervase DOB: [birth year] MRN: [id number]   Date of Service  01/22/2016  HPI/Events of Note  Camera check on patient with nausea and vomiting. Patient previously having "sharp" chest pain radiating into her left jaw and left arm/shoulder. Patient appearing diaphoretic. EKG changes concerning for developing right bundle branch block and lateral ST segment elevation when compared with earlier EKG findings. Case discussed with [facility] Clifton [facility]Al[facility]. Very complex picture that could represent coronary ischemia vs evolving changes with a pulmonary embolus.  eICU Interventions  1. Sublingual NTG prn started 2. Avoiding NTG drip to minimize preload reduction 3. Continuing heparin drip per protocol 4. Continuing ASA 325mg  daily 5. Awaiting stat BMP & Magnesium 6. VQ ordered for high suspicion of pulmonary embolism      Intervention Category Major Interventions: Other:  [name] Sherryn Pollino 01/22/2016, 2:23 AM

## 2016-01-22 NOTE — Progress Notes (Signed)
ANTICOAGULATION CONSULT NOTE Pharmacy Consult for heparin per pharmacy Indication: chest pain/ACS  Allergies  Allergen Reactions  . Penicillins Anaphylaxis and Hives    Patient Measurements: Height: 5\' 4"  (162.6 cm) Weight: 269 lb (122.018 kg) IBW/kg (Calculated) : 54.7 Heparin Dosing Weight: 85 kg  Vital Signs: Temp: 97.6 F (36.4 C) (07/13 0749) Temp Source: Oral (07/13 0749) BP: 92/63 mmHg (07/13 0800) Pulse Rate: 67 (07/13 0800)  Labs:  Recent Labs  01/19/16 2253  01/20/16 1029  01/20/16 2300 01/21/16 0454 01/21/16 1220 01/21/16 2042 01/22/16 0246 01/22/16 0247 01/22/16 0537  HGB 11.6*  --  12.1  --   --  10.9*  --   --   --   --  11.2*  HCT 35.7*  --  36.1  --   --  31.4*  --   --   --   --  34.5*  PLT 188  --  231  --   --  143*  --   --   --   --  166  APTT 31  --   --   --   --   --   --   --   --   --   --   LABPROT 16.8*  --   --   --   --   --   --   --   --   --   --   INR 1.35  --   --   --   --   --   --   --   --   --   --   HEPARINUNFRC  --   --   --   --  0.52 0.44  --   --   --   --   --   CREATININE 7.58*  < > 4.93*  < >  --  2.98*  --   --  1.91*  --  1.72*  CKTOTAL  --   --   --   --   --   --   --  48  --   --   --   TROPONINI  --   --  0.41*  < >  --   --  0.33*  --   --  0.44* 1.28*  < > = values in this interval not displayed.  Estimated Creatinine Clearance: 51 mL/min (by C-G formula based on Cr of 1.72).   Assessment: 50 yo female admitted with n/v/d, began having chest pain on 7/11 and had another episode early this morning.  ECHO completed yesterday showed elevated PA pressure and R heart stain.  Pt was admitted with AKI, therefore cath was deferred until renal fx improved and until pt became more hemodynamically stable. SCr is downtrending 7.5 > 1.7, eCrCl ~ 50 ml/min. CBC stable. Heparin level is therapeutic.   Goal of Therapy:  Heparin level 0.3-0.7 units/ml Monitor platelets by anticoagulation protocol: Yes    Plan:   -Continue heparin at 1100 units/hr -Daily HL, CBC -F/u plans for cath -F/u V/Q + Dopplers   MastersDarl Householder, Jacob Chamblee M 01/22/2016,8:12 AM

## 2016-01-22 NOTE — Progress Notes (Signed)
eLink Physician-Brief Progress Note Patient Name: Lane Hackeraula Elaine Keagle DOB: 1966-02-15 MRN: [id number]   Date of Service  01/22/2016  HPI/Events of Note  Notified by bedside nurse of persistent nausea/vomiting despite use of IV Zofran.   eICU Interventions  Phenergan IV when necessary      Intervention Category Intermediate Interventions: Other:  [name] Taevion Sikora 01/22/2016, 1:24 AM

## 2016-01-22 NOTE — Progress Notes (Signed)
eLink Physician-Brief Progress Note Patient Name: Bonnie Krause DOB: 07/12/1966 MRN: [id number]   Date of Service  01/22/2016  HPI/Events of Note  Camera check on patient status post sublingual nitroglycerin 1. Complete pain relief. Repeat EKG evaluated and markedly improved/changed. Briefly discussed with Dr. Mayford Knifeurner who questioned lead reversal.   eICU Interventions  1. Continuing heparin infusion  2. Continue aspirin 3. Awaiting electrolytes 4. Continue telemetry monitoring      Intervention Category Intermediate Interventions: Other:;Diagnostic test evaluation  Bonnie Krause 01/22/2016, 2:41 AM

## 2016-01-22 NOTE — Progress Notes (Signed)
Pt with persistent bradycardia in the low 40's.  Dopamine started per protocol.  Pt then began having nausea with clear vomit x2.  Order for phenergan received.  Pt then with tele changes, chest pain, left arm pain and jaw pain. Dopamine immediately turned off, EKG obtained, ELink notified, code STEMI initiated.  Nitro SL given X1 with chest pain relief.  Rapid response, PA, and Cardiology to bedside.

## 2016-01-22 NOTE — Progress Notes (Signed)
PATIENT ID: Bonnie Krause is a 74F with hypertension, hyperlipidemia, obesity, chronic diastolic heart failure, and CKD admitted with 2 weeks of diarrhea and found to have acute renal failure, mildly elevated troponin and bradycardia.  Echo revealed severe pulmonary hypertension (PASP 114 mmHg) with a  Severely dilated and moderately hypokinetic RV, normal LVEF and grade 2 diastolic dysfunction.    INTERVAL HISTORY:  Bonnie Krause was started on dopamine for bradycardia and nausea.  Her nausea worsened and she had tachycardia with ventricular bigeminy and emesis so it was stopped. This occurred again overnight.  EKG at that time was concerning for STEMI but after further investigation, there was lead reversal and no STEMI.  Echo returned revealing severe pulmonary hypertension.    SUBJECTIVE:  Feeling much better.  Reports faithful use of her CPAP since 2016.   PHYSICAL EXAM Filed Vitals:   01/22/16 0930 01/22/16 1000 01/22/16 1001 01/22/16 1030  BP: 108/90 113/81 113/81 123/62  Pulse: 54 73 72 54  Temp:      TempSrc:      Resp: 18 19 18 13   Height:      Weight:      SpO2: 95% 95% 96% 95%   General:  Ill-appearing but better . Neck: JVP difficult to assess.  Appears to be above clavicle sitting upright Lungs:  CTAB Heart:  RRR.  No m/r/g.  Distant heart sounds Abdomen:  Soft, NT, ND.  +BS Extremities:  WWP.  Trace edema.   LABS: Lab Results  Component Value Date   TROPONINI 1.28* 01/22/2016   Results for orders placed or performed during the hospital encounter of 01/19/16 (from the past 24 hour(s))  Troponin I     Status: Abnormal   Collection Time: 01/21/16 12:20 PM  Result Value Ref Range   Troponin I 0.33 (HH) <0.03 ng/mL  CK     Status: None   Collection Time: 01/21/16  8:42 PM  Result Value Ref Range   Total CK 48 38 - 234 U/L  Sedimentation rate     Status: Abnormal   Collection Time: 01/21/16  8:42 PM  Result Value Ref Range   Sed Rate 26 (H) 0 - 22 mm/hr  C-reactive  protein     Status: Abnormal   Collection Time: 01/21/16  8:42 PM  Result Value Ref Range   CRP 1.1 (H) <1.0 mg/dL  Basic metabolic panel     Status: Abnormal   Collection Time: 01/22/16  2:46 AM  Result Value Ref Range   Sodium 146 (H) 135 - 145 mmol/L   Potassium 3.3 (L) 3.5 - 5.1 mmol/L   Chloride 107 101 - 111 mmol/L   CO2 30 22 - 32 mmol/L   Glucose, Bld 104 (H) 65 - 99 mg/dL   BUN 71 (H) 6 - 20 mg/dL   Creatinine, Ser 1.611.91 (H) 0.44 - 1.00 mg/dL   Calcium 8.0 (L) 8.9 - 10.3 mg/dL   GFR calc non Af Amer 30 (L) >60 mL/min   GFR calc Af Amer 34 (L) >60 mL/min   Anion gap 9 5 - 15  Magnesium     Status: Abnormal   Collection Time: 01/22/16  2:46 AM  Result Value Ref Range   Magnesium 1.4 (L) 1.7 - 2.4 mg/dL  Troponin I (q 6hr x 3)     Status: Abnormal   Collection Time: 01/22/16  2:47 AM  Result Value Ref Range   Troponin I 0.44 (HH) <0.03 ng/mL  Basic metabolic panel  Status: Abnormal   Collection Time: 01/22/16  5:37 AM  Result Value Ref Range   Sodium 147 (H) 135 - 145 mmol/L   Potassium 3.5 3.5 - 5.1 mmol/L   Chloride 106 101 - 111 mmol/L   CO2 30 22 - 32 mmol/L   Glucose, Bld 92 65 - 99 mg/dL   BUN 67 (H) 6 - 20 mg/dL   Creatinine, Ser 1.61 (H) 0.44 - 1.00 mg/dL   Calcium 8.0 (L) 8.9 - 10.3 mg/dL   GFR calc non Af Amer 34 (L) >60 mL/min   GFR calc Af Amer 39 (L) >60 mL/min   Anion gap 11 5 - 15  Phosphorus     Status: None   Collection Time: 01/22/16  5:37 AM  Result Value Ref Range   Phosphorus 2.7 2.5 - 4.6 mg/dL  Magnesium     Status: None   Collection Time: 01/22/16  5:37 AM  Result Value Ref Range   Magnesium 1.9 1.7 - 2.4 mg/dL  CBC with Differential/Platelet     Status: Abnormal   Collection Time: 01/22/16  5:37 AM  Result Value Ref Range   WBC 8.6 4.0 - 10.5 K/uL   RBC 3.96 3.87 - 5.11 MIL/uL   Hemoglobin 11.2 (L) 12.0 - 15.0 g/dL   HCT 09.6 (L) 04.5 - 40.9 %   MCV 87.1 78.0 - 100.0 fL   MCH 28.3 26.0 - 34.0 pg   MCHC 32.5 30.0 - 36.0 g/dL    RDW 81.1 91.4 - 78.2 %   Platelets 166 150 - 400 K/uL   Neutrophils Relative % 41 %   Neutro Abs 3.5 1.7 - 7.7 K/uL   Lymphocytes Relative 49 %   Lymphs Abs 4.3 (H) 0.7 - 4.0 K/uL   Monocytes Relative 9 %   Monocytes Absolute 0.7 0.1 - 1.0 K/uL   Eosinophils Relative 1 %   Eosinophils Absolute 0.0 0.0 - 0.7 K/uL   Basophils Relative 0 %   Basophils Absolute 0.0 0.0 - 0.1 K/uL  Troponin I (q 6hr x 3)     Status: Abnormal   Collection Time: 01/22/16  5:37 AM  Result Value Ref Range   Troponin I 1.28 (HH) <0.03 ng/mL    Intake/Output Summary (Last 24 hours) at 01/22/16 1039 Last data filed at 01/22/16 1000  Gross per 24 hour  Intake 3402.96 ml  Output   2425 ml  Net 977.96 ml    [place]:  Sinus rhythm, sinus bradycardia, sinus tachycardia, ventricular bigeminy, PVCs  Echo 01/21/16: Study Conclusions  - Left ventricle: The cavity size was normal. Systolic function was  vigorous. The estimated ejection fraction was in the range of 65%  to 70%. Wall motion was normal; there were no regional wall  motion abnormalities. Features are consistent with a pseudonormal  left ventricular filling pattern, with concomitant abnormal  relaxation and increased filling pressure (grade 2 diastolic  dysfunction). Doppler parameters are consistent with elevated  ventricular end-diastolic filling pressure. - Ventricular septum: The contour showed diastolic flattening and  systolic flattening. - Aortic valve: There was mild regurgitation. - Mitral valve: Calcified annulus. Mildly thickened leaflets .  There was mild regurgitation. - Right ventricle: The cavity size was severely dilated. Wall  thickness was normal. Systolic function was moderately reduced. - Right atrium: The atrium was severely dilated. - Tricuspid valve: There was moderate regurgitation. - Pulmonary arteries: Systolic pressure was severely increased. PA  peak pressure: 114 mm Hg (S).  Impressions:  - LVEF  normal.  Grade 2 diastolic dysfunction with elevated filling pressures.   Severely dilated right sided chambers with severely elevated RVSP  out of proportion to the LV diastolic dysfunction.  Suspect pulmonary etiology, ? pulmonary embolism.  ASSESSMENT AND PLAN:  Principal Problem:   Nausea, vomiting, and diarrhea Active Problems:   Chest pain   Essential hypertension   HLD (hyperlipidemia)   Hypothyroidism   Chronic CHF (congestive heart failure) (HCC)   Yeast infection   Abdominal pain   Hypotension   OSA (obstructive sleep apnea)   Shock circulatory (HCC)   Acute renal failure (ARF) (HCC)   Hyperkalemia   Bradycardia   Elevated troponin   EKG abnormalities   NSTEMI (non-ST elevated myocardial infarction) (HCC)   # Severe pulmonary hypertension:  # Systemic hypotension: Echo revealed RV dysfunction and severely elevated pulmonary pressures.  V/Q scan was negative for PE.  She has been treated for OSA so this seems less likely to be the cause.  LV revealed grade 2 diastolic dysfunction with normal systolic function.  When stable she will need LHC/RHC to evaluate.  Renal function continues to improve rapidly.  Will make NPO at midnight in case renal function normalizes and she can get [facility] tomorrow.  Agree with PAH work up per CCM.  She will need PFTs when stable.  Will add HIV testing.   # Bradycardia: Etiology unclear.  May be related to her RV dysfunction.  Given the lack of wall motion abnormalities on echo, RCA stenosis seems less likely.  LHC/RHC as above.  Avoiding nodal agents and no more dopamine.   Rhyleigh Grassel C. Duke Salvia, MD, [facility] 01/22/2016 10:39 AM

## 2016-01-22 NOTE — Progress Notes (Signed)
PULMONARY / CRITICAL CARE MEDICINE   Name: Bonnie Krause MRN: [id number] DOB: 08-21-1965    ADMISSION DATE:  01/19/2016 CONSULTATION DATE:  01/19/16  REFERRING MD:  Clyde Lundborg [facility])  CHIEF COMPLAINT:  N/V/D, hypotension  HISTORY OF PRESENT ILLNESS:   Bonnie Krause is a 50 y.o. female with PMH as outlined below. She was seen by PCP 7/10 for N/V/D, abd pain, generalized weakness x 3 - 4 weeks.  At PCP office, she was found to be hypotensive so was sent to [facility] for further evaluation.  At [facility], she had BP in 70's and was found to have acute renal failure with SCr 9.50.  She was transferred to [facility] for further evaluation and management and was admitted by [facility] team.  PCCM was called later that evening for persistent hypotension with SBP in 70's - 80's.  Of note, she has remained awake with normal mental status since admission and state she occasionally has low BP but is unable to tell me what her pressures typically run.  She states N/V/D have been going on for roughly 3 - 4 weeks and have been associated with abd pain.  Diarrhea has been as many as 8 times per night, vomiting 2 - 3 times per day.  She has not tolerated any food as she has never been hungry.  Has been trying to drink as much water as possible, occasionally able to keep it down.  Has had intermittent subjective fevers and chills.  Denies any hematochezia or melena. Has had dull chest tightness / pain with radiation into left arm with numbness / tingling that started at the same time her N/V/D did 3 - 4 weeks ago.  This has not changed since in character or frequency.  Denies lightheadedness, diaphoresis, syncope.  SUBJECTIVE:   Dopamine started for Bradycardia, patient then began having chest pain, L arm and jaw pain, and nausea so Dopamine was turned off Code STEMI was called and patient given Nitro Nitro and Zofran relieved symptoms  EKG showed R BBB and lateral ST elevation. There was suspicion for limb  lead reversal Cardiology saw patient and determined likely not STEMI and not a candidate contrast at this time Heparin drip continued for possible STEMI vs PE Creatinine continues to improve  VITAL SIGNS: BP 93/60 mmHg  Pulse 60  Temp(Src) 97.8 F (36.6 C) (Oral)  Resp 13  Ht 5\' 4"  (1.626 m)  Wt 269 lb (122.018 kg)  BMI 46.15 kg/m2  SpO2 99%  LMP  (LMP Unknown)  HEMODYNAMICS:    VENTILATOR SETTINGS:    INTAKE / OUTPUT: I/O last 3 completed shifts: In: 5207.3 [I.V.:3691.3; Other:210; IV Piggyback:1306] Out: 5030 [Urine:5030]   PHYSICAL EXAMINATION: General: Middle aged female, in NAD.  Neuro: A&O x 3, non-focal.  HEENT: [place]/AT. PERRL, sclera anicteric. Cardiovascular: RRR, no M/R/G.  Lungs: On [place], normal work of breathing, CTA bilaterally Abdomen: Morbidly obese.  BS x 4, soft, tender without guarding or rebound. Musculoskeletal: No gross deformities, no edema. Negative Homan sign bilaterally Skin: Intact, warm, no rashes, lower ext ecchymosis and hyperpigmentation  LABS:  BMET  Recent Labs Lab 01/20/16 1620 01/21/16 0454 01/22/16 0246  NA 141 145 146*  K 4.7 3.5 3.3*  CL 114* 112* 107  CO2 13* 21* 30  BUN 139* 111* 71*  CREATININE 4.25* 2.98* 1.91*  GLUCOSE 90 89 104*    Electrolytes  Recent Labs Lab 01/20/16 1029 01/20/16 1620 01/21/16 0454 01/22/16 0246  CALCIUM 7.6* 7.8* 7.8* 8.0*  MG 1.6*  --  1.4* 1.4*  PHOS 8.2*  --  5.5*  --     CBC  Recent Labs Lab 01/20/16 1029 01/21/16 0454 01/22/16 0537  WBC 6.5 3.8* 8.6  HGB 12.1 10.9* 11.2*  HCT 36.1 31.4* 34.5*  PLT 231 143* 166    Coag's  Recent Labs Lab 01/19/16 2253  APTT 31  INR 1.35    Sepsis Markers  Recent Labs Lab 01/19/16 2253 01/19/16 2259 01/20/16 0219  LATICACIDVEN  --  0.9 0.5  PROCALCITON 0.78  --   --     ABG No results for input(s): PHART, PCO2ART, PO2ART in the last 168 hours.  Liver Enzymes  Recent Labs Lab 01/19/16 2253 01/20/16 0219  AST  11* 10*  ALT 13* 12*  ALKPHOS 52 48  BILITOT 0.9 0.9  ALBUMIN 3.2* 3.0*    Cardiac Enzymes  Recent Labs Lab 01/20/16 2230 01/21/16 1220 01/22/16 0247  TROPONINI 0.63* 0.33* 0.44*    Glucose No results for input(s): GLUCAP in the last 168 hours.  Imaging No results found.   STUDIES:  CT A / P 7/10 > extensive diverticulosis of sigmoid without diverticulitis.  Suspect colitis of infectious or inflammatory nature of ascending colon and cecum.  Lymph nodes likely reactive.  3mm calculus within porta hepatis, suspicious for small stone within gallbladder neck or cystic duct.  No evidence of cholecystitis. 7/10 renal US negative  CULTURES: Stool GI PCR 7/10 > C.diff PCR 7/10 > Blood 7/10 >  ANTIBIOTICS: Cipro 7/10 > Flagyl 7/10 >  SIGNIFICANT EVENTS: 7/10 > transferred to [facility] with hypotension and acute renal failure. 7/11 inferior ekg changes  LINES/TUBES: Left radial a line 7/10 >7/12  DISCUSSION: 50 y.o. F transferred from [place] with N/V/D and abd pain x 3 -4 weeks, acute renal failure, hypotension. PCCM called for assistance evening of 7/10. 7/12 creatine continues to improve. Will dc a line 7/12. May be ready to go to SDU 24-48 hours.  ASSESSMENT / PLAN:  CARDIOVASCULAR A:  Hypotension- suspect hypovolemic due to decreased PO intake + N/V/D x 3 - 4 weeks Improving Bradycardia- Intermittent Chest pain- Improves with Nitro Suspicion for PE vs STEMI- Severe Pulmonary Hypertension and R Heart Strain on 7/12 Echo.  Elevated Troponins (0.33>0.44>1.28)  Hx HLD, dCHF (no echo results available in system), chronic venous insufficiency.    P:  Continue fluids  Solucortef for adrenal insuff, start weaning 7/12 Cardiology following, appreciate their assistance Left heart cath when creatinine stabilizes LE dopplers and V/Q scan to rule out PE Consider CVL placement if no improvement Continue outpatient atorvastatin, aspirin Hold outpatient lisinopril,  spironolactone, furosemide  RENAL A:   AKI AG + NAG metabolic acidosis - uremia + diarrhea. Gap closed  Hypocalcemia  Hypokalemia Hypomagnesemia P:   Follow lytes Renal following Repleting K and Mag  GASTROINTESTINAL A:   Colitis Diverticulosis Nutrition P:   Abx per ID section Follow stool cultures Off enteric precautions as no BM since admission Start clear liquid diet, ADAT Zofran and Phenergan PRN for N/V  PULMONARY A: Acute hypoxic respiratory failure- On 4 L [place] during daytime OSA with probable OHS - on nocturnal CPAP. P:   Continue supplemental O2 as needed to maintain SpO2 > 92%. Pulmonary hygiene. Continue nocturnal CPAP CXR intermittently; CXR from 7/13 normal  HEMATOLOGIC A:   VTE Prophylaxis. P:  SCD's / heparin. CBC in AM.  INFECTIOUS A:   Colitis. P:   Day 4 of Cipro & Flagyl, will DC  today as there are no signs of colitis since admission  ENDOCRINE A:   Hypothyroidism.    TSH.2.2 P:   Continue outpatient synthroid.   NEUROLOGIC A:   No acute issues. P:   No interventions required.  Family updated: None available.  Interdisciplinary Family Meeting v Palliative Care Meeting:  Due by: 7/17.   Anders Simmondshristina Gambino, MD [facility], PGY-2  Attending note: Chest pain better.  Alert.  HR regular, brady.  No wheeze.  Abd soft.  V/Q - no PE Echo - EF 65 to 70%, grade 2 diastolic CHF, mild AR, mild MR, severe RV dilation, mod TR, PAS 114 mmHg  Creatinine 1.72, Troponin 1.28.  Assessment/plan: Chest pain, ECG changes, pulmonary HTN, bradycardia - continue heparin gtt - will need Rt and Lt heart cath when renal fx better  Septic/hypovolemic shock >> off pressors. - monitor hemodynamics  AKI with metabolic acidosis >> improving. - d/c HCO3 from IV fluid  Colitis >> improved. - d/c Abx  Sleep disordered breathing with elevated Rt hemidiaphragm. - CPAP qhs  Updated family at bedside.  Coralyn HellingVineet Keaisha Sublette,  MD [facility]/Critical [facility] 01/22/2016, 12:01 PM Pager:  [phone] After 3pm call: [phone]

## 2016-01-22 NOTE — Progress Notes (Signed)
eLink Physician-Brief Progress Note Patient Name: Bonnie Krause DOB: September 20, 1965 MRN: [id number]   Date of Service  01/22/2016  HPI/Events of Note  Potassium 3.3. Magnesium 1.4. Creatinine 1.91. Has peripheral IV access.   eICU Interventions  1. Magnesium sulfate 3 g IV 2. KCl 10 mEq IV 6 runs      Intervention Category Intermediate Interventions: Electrolyte abnormality - evaluation and management  [name] [name] 01/22/2016, 3:53 AM

## 2016-01-22 NOTE — Progress Notes (Signed)
*  PRELIMINARY RESULTS* Vascular Ultrasound Lower extremity venous duplex has been completed.  Preliminary findings: Technically difficult due to pt position and body habitus. Appears to be small segment of DVT noted in the right popliteal vein. No other DVT visualized bilaterally.   Farrel DemarkJill Eunice, RDMS, RVT  01/22/2016, 2:42 PM

## 2016-01-22 NOTE — Progress Notes (Signed)
CTSP secondary to nurse activating code STEMI. Patient admitted with N/V/acute renal failure and diarrhea with hypotension.  Cardiology was consulted yesterday for elevated trop that was felt due to demand ischemia in setting of severe illness.  Tonight had an episode of chest pressure with some radiation to her left arm.  She had this as well yesterday with EKG at that time showing diffuse ST changes.  2D echo yesterday showed hyperdynamic LVF with severe pulmonary HTN with PASP > 115mmHG and right sided enlargement most consistent with acute PE.  EKG tonight during CP initially showed new Q waves in I and aVL and RBBB consistent with limb lead reversal and read out as STEMI and code STEMI activated.  Repeat EKG showed < 1mm of J point elevation in I ad aVL with improvement in the ST depression in the lateral precordial leads on EKG earlier yesterday.  Patient given SL NTG x 1 by elink with resolution of CP.  She admits to LE swelling and pain recently.  Discussed with Dr. Clifton JamesMcAlhany and code STEMI cancelled.  Given her acute renal failure she would be high risk for constrast induced nephropathy at this time and this is most likely not due to acute coronary syndrome.  LE venous dopplers and VQ scan pending.  She is on IV Heparin gtt per pharmacy.

## 2016-01-22 NOTE — Consult Note (Signed)
Admit: 01/19/2016 LOS: 3  89F with ?hx/o CKD admit with severe AKI, hypotension, hypovolemia, N/V/D for several weeks. On ACEi+Diuretics+NSAIDs at home.  Subjective:  Found to have elevated R sided pressure on TTE and concern for PE on hep gtt Off vasopresssors Great UOP Renal function improving quickly    07/12 0701 - 07/13 0700 In: 3165.3 [I.V.:2059.3; IV Piggyback:906] Out: 2580 [Urine:2580]  Filed Weights   01/20/16 0100  Weight: 122.018 kg (269 lb)    Scheduled Meds: . aspirin EC  325 mg Oral Daily  . atorvastatin  40 mg Oral q1800  . ciprofloxacin  400 mg Intravenous QHS  . fluconazole  100 mg Oral Daily  . hydrocortisone sod succinate (SOLU-CORTEF) inj  50 mg Intravenous Q12H  . levothyroxine  175 mcg Oral QAC breakfast  . metronidazole  500 mg Intravenous Q8H  . mometasone-formoterol  2 puff Inhalation BID  . pantoprazole  40 mg Oral Q1200  . potassium chloride  10 mEq Intravenous Q1 Hr x 6   Continuous Infusions: . DOPamine Stopped (01/22/16 0134)  . heparin 1,100 Units/hr (01/21/16 1332)  . phenylephrine (NEO-SYNEPHRINE) Adult infusion Stopped (01/20/16 1750)  .  sodium bicarbonate 150 mEq in sterile water 1000 mL infusion 75 mL/hr at 01/22/16 0434   PRN Meds:.acetaminophen, albuterol, fentaNYL (SUBLIMAZE) injection, nitroGLYCERIN, ondansetron (ZOFRAN) IV, promethazine  Current Labs: reviewed    Physical Exam:  Blood pressure 92/63, pulse 67, temperature 97.6 F (36.4 C), temperature source Oral, resp. rate 18, height 5\' 4"  (1.626 m), weight 122.018 kg (269 lb), SpO2 98 %. GEN: Obese, groggy but awake ENT: NCAT EYES: EOMI CV: RRR, nl s1s2 PULM: CTAB ant auscultation ABD: obese, soft SKIN: no rashes/lesions EXT:no LEE  7/11 Renal US: no HN/obstruction  Assessment  1. PreRenal AKI 2/2 hypovolemia 2/2 #3, BP meds; improving 2. Hypotension, hypovolemic on PE gtt -- ? PE and/or hypovolemia 3. N/V/D, present for weeks on Cipro/Flagyl; w/u  pending 4. Metabolic acidosis likley 2/2 #1 and #3; resolved 5. Sinus Bradycardia, Positie Troponine -- Cardiology seeing 6. HTN at home on ACEi, diuretics 7. ? PE, pulm HTN  Plan 1. Renal function has improved quickly.  Will sign off now. Please call with any questions or concerns   Sabra Heckyan Jessieca Rhem MD 01/22/2016, 8:08 AM   Recent Labs Lab 01/20/16 1029  01/21/16 0454 01/22/16 0246 01/22/16 0537  NA 141  < > 145 146* 147*  K 5.0  < > 3.5 3.3* 3.5  CL 116*  < > 112* 107 106  CO2 11*  < > 21* 30 30  GLUCOSE 85  < > 89 104* 92  BUN 150*  < > 111* 71* 67*  CREATININE 4.93*  < > 2.98* 1.91* 1.72*  CALCIUM 7.6*  < > 7.8* 8.0* 8.0*  PHOS 8.2*  --  5.5*  --  2.7  < > = values in this interval not displayed.  Recent Labs Lab 01/20/16 1029 01/21/16 0454 01/22/16 0537  WBC 6.5 3.8* 8.6  NEUTROABS 5.2 2.6 3.5  HGB 12.1 10.9* 11.2*  HCT 36.1 31.4* 34.5*  MCV 85.5 83.5 87.1  PLT 231 143* 166

## 2016-01-22 NOTE — Care Management Note (Signed)
Case Management Note  Patient Details  Name: Bonnie Krause MRN: [id number] Date of Birth: [birth year]  Subjective/Objective:    Pt admitted with STEMI that was reversed - severely high pulm pressures                Action/Plan:  PTA from home independent.  CM will continue to monitor for discharge needs   Expected Discharge Date:                  Expected Discharge Plan:  Home w Home Health Services  In-House Referral:     Discharge planning Services  CM Consult  Post Acute Care Choice:    Choice offered to:     DME Arranged:    DME Agency:     HH Arranged:    HH Agency:     Status of Service:  In process, will continue to follow  If discussed at Long Length of Stay Meetings, dates discussed:    Additional Comments:  Bonnie Krause, Bonnie Bettes S, RN 01/22/2016, 9:35 PM

## 2016-01-23 ENCOUNTER — Encounter (HOSPITAL_COMMUNITY)
Admission: AD | Disposition: A | Discharge status: Home Health | Payer: Self-pay | Source: Other Acute Inpatient Hospital | Attending: Internal Medicine

## 2016-01-23 DIAGNOSIS — G4733 Obstructive sleep apnea (adult) (pediatric): Secondary | ICD-10-CM

## 2016-01-23 DIAGNOSIS — R7989 Other specified abnormal findings of blood chemistry: Secondary | ICD-10-CM

## 2016-01-23 DIAGNOSIS — I272 Other secondary pulmonary hypertension: Principal | ICD-10-CM

## 2016-01-23 HISTORY — PX: CARDIAC CATHETERIZATION: SHX172

## 2016-01-23 LAB — BASIC METABOLIC PANEL
Anion gap: 6 (ref 5–15)
BUN: 36 mg/dL — AB (ref 6–20)
CO2: 32 mmol/L (ref 22–32)
CREATININE: 1.22 mg/dL — AB (ref 0.44–1.00)
Calcium: 8.3 mg/dL — ABNORMAL LOW (ref 8.9–10.3)
Chloride: 106 mmol/L (ref 101–111)
GFR, EST AFRICAN AMERICAN: 59 mL/min — AB (ref >60)
GFR, EST NON AFRICAN AMERICAN: 51 mL/min — AB (ref >60)
Glucose, Bld: 111 mg/dL — ABNORMAL HIGH (ref 65–99)
Potassium: 4 mmol/L (ref 3.5–5.1)
SODIUM: 144 mmol/L (ref 135–145)

## 2016-01-23 LAB — CBC
HCT: 36.7 % (ref 36.0–46.0)
HEMOGLOBIN: 11.2 g/dL — AB (ref 12.0–15.0)
MCH: 27.5 pg (ref 26.0–34.0)
MCHC: 30.5 g/dL (ref 30.0–36.0)
MCV: 90 fL (ref 78.0–100.0)
PLATELETS: 196 10*3/uL (ref 150–400)
RBC: 4.08 MIL/uL (ref 3.87–5.11)
RDW: 15.6 % — ABNORMAL HIGH (ref 11.5–15.5)
WBC: 10.1 10*3/uL (ref 4.0–10.5)

## 2016-01-23 LAB — ANTINUCLEAR ANTIBODIES, IFA: ANA Ab, IFA: NEGATIVE

## 2016-01-23 LAB — MISC LABCORP TEST (SEND OUT)
LABCORP TEST CODE: 164914
Labcorp test code: 164814

## 2016-01-23 LAB — ANTI-SCLERODERMA ANTIBODY: Scleroderma (Scl-70) (ENA) Antibody, IgG: <0.2 AI (ref 0.0–0.9)

## 2016-01-23 LAB — RHEUMATOID FACTOR: Rhuematoid fact SerPl-aCnc: 14 IU/mL — ABNORMAL HIGH (ref 0.0–13.9)

## 2016-01-23 LAB — MAGNESIUM: MAGNESIUM: 2.5 mg/dL — AB (ref 1.7–2.4)

## 2016-01-23 LAB — POCT I-STAT 3, VENOUS BLOOD GAS (G3P V)
Acid-Base Excess: 10 mmol/L — ABNORMAL HIGH (ref 0.0–2.0)
Bicarbonate: 35.7 mEq/L — ABNORMAL HIGH (ref 20.0–24.0)
O2 SAT: 43 %
PCO2 VEN: 54.9 mmHg — AB (ref 45.0–50.0)
PH VEN: 7.421 — AB (ref 7.250–7.300)
PO2 VEN: 24 mmHg — AB (ref 31.0–45.0)
TCO2: 37 mmol/L (ref 0–100)

## 2016-01-23 LAB — HIV ANTIBODY (ROUTINE TESTING W REFLEX): HIV SCREEN 4TH GENERATION: NONREACTIVE

## 2016-01-23 LAB — HEPARIN LEVEL (UNFRACTIONATED): HEPARIN UNFRACTIONATED: 0.33 [IU]/mL (ref 0.30–0.70)

## 2016-01-23 LAB — ANTI-SMITH ANTIBODY

## 2016-01-23 LAB — POCT ACTIVATED CLOTTING TIME: Activated Clotting Time: 180 seconds

## 2016-01-23 LAB — PHOSPHORUS: PHOSPHORUS: 2.1 mg/dL — AB (ref 2.5–4.6)

## 2016-01-23 SURGERY — RIGHT/LEFT HEART CATH AND CORONARY ANGIOGRAPHY
Anesthesia: LOCAL

## 2016-01-23 MED ORDER — HEPARIN (PORCINE) IN NACL 2-0.9 UNIT/ML-% IJ SOLN
INTRAMUSCULAR | Status: DC | PRN
  Administered 2016-01-23: 15:00:00 via INTRA_ARTERIAL

## 2016-01-23 MED ORDER — IOPAMIDOL (ISOVUE-370) INJECTION 76%
INTRAVENOUS | Status: AC
  Filled 2016-01-23: qty 50

## 2016-01-23 MED ORDER — HEPARIN (PORCINE) IN NACL 100-0.45 UNIT/ML-% IJ SOLN
1300.0000 [IU]/h | INTRAMUSCULAR | Status: DC
Start: 2016-01-24 — End: 2016-01-28
  Administered 2016-01-23: 1100 [IU]/h via INTRAVENOUS
  Administered 2016-01-24 – 2016-01-28 (×5): 1300 [IU]/h via INTRAVENOUS
  Filled 2016-01-23 (×9): qty 250

## 2016-01-23 MED ORDER — IOPAMIDOL (ISOVUE-370) INJECTION 76%
INTRAVENOUS | Status: DC | PRN
  Administered 2016-01-23: 80 mL via INTRA_ARTERIAL

## 2016-01-23 MED ORDER — MIDAZOLAM HCL 2 MG/2ML IJ SOLN
INTRAMUSCULAR | Status: AC
  Filled 2016-01-23: qty 2

## 2016-01-23 MED ORDER — SODIUM CHLORIDE 0.9% FLUSH: 3.0000 mL | INTRAVENOUS | Status: DC | PRN

## 2016-01-23 MED ORDER — SODIUM CHLORIDE 0.9 % IV SOLN: 250.0000 mL | INTRAVENOUS | Status: DC | PRN

## 2016-01-23 MED ORDER — LIDOCAINE HCL (PF) 1 % IJ SOLN
INTRAMUSCULAR | Status: DC | PRN
  Administered 2016-01-23: 2 mL via INTRADERMAL
  Administered 2016-01-23: 15 mL via INTRADERMAL
  Administered 2016-01-23: 2 mL via INTRADERMAL

## 2016-01-23 MED ORDER — FUROSEMIDE 10 MG/ML IJ SOLN
40.0000 mg | Freq: Two times a day (BID) | INTRAMUSCULAR | Status: DC
  Administered 2016-01-23 – 2016-01-24 (×3): 40 mg via INTRAVENOUS
  Filled 2016-01-23 (×3): qty 4

## 2016-01-23 MED ORDER — SODIUM CHLORIDE 0.9 % IV SOLN
INTRAVENOUS | Status: DC
  Administered 2016-01-24: 06:00:00 via INTRAVENOUS

## 2016-01-23 MED ORDER — FENTANYL CITRATE (PF) 100 MCG/2ML IJ SOLN
INTRAMUSCULAR | Status: DC | PRN
  Administered 2016-01-23 (×2): 25 ug via INTRAVENOUS

## 2016-01-23 MED ORDER — VERAPAMIL HCL 2.5 MG/ML IV SOLN
INTRAVENOUS | Status: AC
  Filled 2016-01-23: qty 2

## 2016-01-23 MED ORDER — IOPAMIDOL (ISOVUE-370) INJECTION 76%
INTRAVENOUS | Status: AC
  Filled 2016-01-23: qty 100

## 2016-01-23 MED ORDER — HEPARIN SODIUM (PORCINE) 1000 UNIT/ML IJ SOLN
INTRAMUSCULAR | Status: AC
  Filled 2016-01-23: qty 1

## 2016-01-23 MED ORDER — MIDAZOLAM HCL 2 MG/2ML IJ SOLN
INTRAMUSCULAR | Status: DC | PRN
  Administered 2016-01-23 (×2): 1 mg via INTRAVENOUS

## 2016-01-23 MED ORDER — HEPARIN SODIUM (PORCINE) 1000 UNIT/ML IJ SOLN
INTRAMUSCULAR | Status: DC | PRN
  Administered 2016-01-23: 6000 [IU] via INTRAVENOUS

## 2016-01-23 MED ORDER — ASPIRIN 81 MG PO CHEW: 81.0000 mg | CHEWABLE_TABLET | ORAL | Status: DC

## 2016-01-23 MED ORDER — SODIUM CHLORIDE 0.9% FLUSH
3.0000 mL | Freq: Two times a day (BID) | INTRAVENOUS | Status: DC
  Administered 2016-01-23: 3 mL via INTRAVENOUS

## 2016-01-23 MED ORDER — FENTANYL CITRATE (PF) 100 MCG/2ML IJ SOLN
INTRAMUSCULAR | Status: AC
  Filled 2016-01-23: qty 2

## 2016-01-23 MED ORDER — ASPIRIN EC 325 MG PO TBEC
325.0000 mg | DELAYED_RELEASE_TABLET | Freq: Every day | ORAL | Status: DC
  Filled 2016-01-23: qty 1

## 2016-01-23 MED ORDER — HEPARIN (PORCINE) IN NACL 100-0.45 UNIT/ML-% IJ SOLN: 1100.0000 [IU]/h | INTRAMUSCULAR | Status: DC

## 2016-01-23 MED ORDER — HEPARIN (PORCINE) IN NACL 2-0.9 UNIT/ML-% IJ SOLN
INTRAMUSCULAR | Status: DC | PRN
  Administered 2016-01-23: 1500 mL

## 2016-01-23 MED ORDER — SODIUM CHLORIDE 0.9 % IV SOLN: INTRAVENOUS | Status: AC

## 2016-01-23 MED ORDER — SODIUM CHLORIDE 0.9% FLUSH: 3.0000 mL | Freq: Two times a day (BID) | INTRAVENOUS | Status: DC

## 2016-01-23 MED ORDER — ONDANSETRON HCL 4 MG/2ML IJ SOLN
INTRAMUSCULAR | Status: DC | PRN
  Administered 2016-01-23: 4 mg via INTRAVENOUS

## 2016-01-23 SURGICAL SUPPLY — 18 items
CATH EXPO 5F MPA-1 (CATHETERS) ×1 IMPLANT
CATH INFINITI 5 FR 3DRC (CATHETERS) ×1 IMPLANT
CATH INFINITI 5 FR JL3.5 (CATHETERS) ×1 IMPLANT
CATH INFINITI JR4 5F (CATHETERS) ×1 IMPLANT
CATH SWAN GANZ 7F STRAIGHT (CATHETERS) ×1 IMPLANT
DEVICE RAD COMP TR BAND LRG (VASCULAR PRODUCTS) ×1 IMPLANT
GLIDESHEATH SLEND SS 6F .021 (SHEATH) ×1 IMPLANT
KIT HEART LEFT (KITS) ×2 IMPLANT
KIT HEART RIGHT NAMIC (KITS) ×2 IMPLANT
PACK CARDIAC CATHETERIZATION (CUSTOM PROCEDURE TRAY) ×2 IMPLANT
SHEATH FAST CATH BRACH 5F 5CM (SHEATH) ×1 IMPLANT
SHEATH PINNACLE 5F 10CM (SHEATH) ×1 IMPLANT
SHEATH PINNACLE 7F 10CM (SHEATH) ×1 IMPLANT
SYR MEDRAD MARK V 150ML (SYRINGE) IMPLANT
TRANSDUCER W/STOPCOCK (MISCELLANEOUS) ×3 IMPLANT
TUBING CIL FLEX 10 FLL-RA (TUBING) ×2 IMPLANT
WIRE EMERALD 3MM-J .025X260CM (WIRE) ×1 IMPLANT
WIRE SAFE-T 1.5MM-J .035X260CM (WIRE) ×1 IMPLANT

## 2016-01-23 NOTE — Progress Notes (Signed)
Pt complaining of increase pain to left ankle. Upon assessment left foot is warm to the touch, left dorsalis pedis palpable but weak, and capillary refill greater than 4 seconds. Patient given of fentanyl without relief and Heparin started per order. MD notified and advised to give the other of fentanyl. Will continue to monitor.

## 2016-01-23 NOTE — Progress Notes (Signed)
Please see cath note. Mean PA pressure 84 mm Hg (135/51). No CAD. Normal LVEDP.   Verne CarrowChristopher McAlhany 01/23/2016 3:30 PM

## 2016-01-23 NOTE — Progress Notes (Addendum)
PATIENT ID: Bonnie Krause is a 44F with hypertension, hyperlipidemia, obesity, chronic diastolic heart failure, and CKD admitted with 2 weeks of diarrhea and found to have acute renal failure, mildly elevated troponin and bradycardia.  Echo revealed severe pulmonary hypertension (PASP 114 mmHg) with a  Severely dilated and moderately hypokinetic RV, normal LVEF and grade 2 diastolic dysfunction.    INTERVAL HISTORY:  No events overnight.    SUBJECTIVE:  Feeling better.  Able to lay flat.  Notes LE edema   PHYSICAL EXAM Filed Vitals:   01/23/16 0730 01/23/16 0744 01/23/16 0800 01/23/16 0830  BP: 110/68  123/74 126/77  Pulse: 48  47 78  Temp:  97.7 F (36.5 C)    TempSrc:  Oral    Resp: '16  18 18  '$ Height:      Weight:      SpO2: 91%  99% 95%   General:  Well-appearing.  No acute distress. Neck: JVP difficult to assess.  Appears to be above clavicle sitting upright Lungs:  CTAB Heart:  RRR.  No m/r/g.  Distant heart sounds Abdomen:  Soft, NT, ND.  +BS Extremities:  WWP.  1+ pitting edema.  LABS: Lab Results  Component Value Date   TROPONINI 0.78* 01/22/2016   Results for orders placed or performed during the hospital encounter of 01/19/16 (from the past 24 hour(s))  Troponin I     Status: Abnormal   Collection Time: 01/22/16  1:12 PM  Result Value Ref Range   Troponin I 0.71 (HH) <0.03 ng/mL  Heparin level (unfractionated)     Status: None   Collection Time: 01/22/16  1:12 PM  Result Value Ref Range   Heparin Unfractionated 0.40 0.30 - 0.70 IU/mL  HIV antibody     Status: None   Collection Time: 01/22/16  4:16 PM  Result Value Ref Range   HIV Screen 4th Generation wRfx Non Reactive Non Reactive  Troponin I     Status: Abnormal   Collection Time: 01/22/16  4:16 PM  Result Value Ref Range   Troponin I 0.78 (HH) <0.03 ng/mL  Heparin level (unfractionated)     Status: None   Collection Time: 01/23/16  2:12 AM  Result Value Ref Range   Heparin Unfractionated 0.33 0.30 -  0.70 IU/mL  CBC     Status: Abnormal   Collection Time: 01/23/16  2:12 AM  Result Value Ref Range   WBC 10.1 4.0 - 10.5 K/uL   RBC 4.08 3.87 - 5.11 MIL/uL   Hemoglobin 11.2 (L) 12.0 - 15.0 g/dL   HCT 36.7 36.0 - 46.0 %   MCV 90.0 78.0 - 100.0 fL   MCH 27.5 26.0 - 34.0 pg   MCHC 30.5 30.0 - 36.0 g/dL   RDW 15.6 (H) 11.5 - 15.5 %   Platelets 196 150 - 400 K/uL  Basic metabolic panel     Status: Abnormal   Collection Time: 01/23/16  2:12 AM  Result Value Ref Range   Sodium 144 135 - 145 mmol/L   Potassium 4.0 3.5 - 5.1 mmol/L   Chloride 106 101 - 111 mmol/L   CO2 32 22 - 32 mmol/L   Glucose, Bld 111 (H) 65 - 99 mg/dL   BUN 36 (H) 6 - 20 mg/dL   Creatinine, Ser 1.22 (H) 0.44 - 1.00 mg/dL   Calcium 8.3 (L) 8.9 - 10.3 mg/dL   GFR calc non Af Amer 51 (L) >60 mL/min   GFR calc Af Amer 59 (L) >60  mL/min   Anion gap 6 5 - 15  Magnesium     Status: Abnormal   Collection Time: 01/23/16  2:12 AM  Result Value Ref Range   Magnesium 2.5 (H) 1.7 - 2.4 mg/dL  Phosphorus     Status: Abnormal   Collection Time: 01/23/16  2:12 AM  Result Value Ref Range   Phosphorus 2.1 (L) 2.5 - 4.6 mg/dL    Intake/Output Summary (Last 24 hours) at 01/23/16 0853 Last data filed at 01/23/16 0800  Gross per 24 hour  Intake   2714 ml  Output   2000 ml  Net    714 ml    Telemertry:  Sinus rhythm, sinus bradycardia, sinus tachycardia, ventricular bigeminy, PVCs  Echo 01/21/16: Study Conclusions  - Left ventricle: The cavity size was normal. Systolic function was  vigorous. The estimated ejection fraction was in the range of 65%  to 70%. Wall motion was normal; there were no regional wall  motion abnormalities. Features are consistent with a pseudonormal  left ventricular filling pattern, with concomitant abnormal  relaxation and increased filling pressure (grade 2 diastolic  dysfunction). Doppler parameters are consistent with elevated  ventricular end-diastolic filling pressure. -  Ventricular septum: The contour showed diastolic flattening and  systolic flattening. - Aortic valve: There was mild regurgitation. - Mitral valve: Calcified annulus. Mildly thickened leaflets .  There was mild regurgitation. - Right ventricle: The cavity size was severely dilated. Wall  thickness was normal. Systolic function was moderately reduced. - Right atrium: The atrium was severely dilated. - Tricuspid valve: There was moderate regurgitation. - Pulmonary arteries: Systolic pressure was severely increased. PA  peak pressure: 114 mm Hg (S).  Impressions:  - LVEF normal.  Grade 2 diastolic dysfunction with elevated filling pressures.   Severely dilated right sided chambers with severely elevated RVSP  out of proportion to the LV diastolic dysfunction.  Suspect pulmonary etiology, ? pulmonary embolism.  ASSESSMENT AND PLAN:  Principal Problem:   Nausea, vomiting, and diarrhea Active Problems:   Chest pain   Essential hypertension   HLD (hyperlipidemia)   Hypothyroidism   Chronic CHF (congestive heart failure) (HCC)   Yeast infection   Abdominal pain   Hypotension   OSA (obstructive sleep apnea)   Shock circulatory (HCC)   Acute renal failure (ARF) (HCC)   Hyperkalemia   Bradycardia   Elevated troponin   EKG abnormalities   NSTEMI (non-ST elevated myocardial infarction) ([place])   Pulmonary hypertension (HCC)   # Severe pulmonary hypertension:  # Systemic hypotension: Echo revealed RV dysfunction and severely elevated pulmonary pressures.  V/Q scan was negative for PE, though it appears she has a small R popliteal DVT.  She has been treated for OSA so this seems less likely to be the cause.  LV revealed grade 2 diastolic dysfunction with normal systolic function.  Renal function has improved significantly so we will plan for LHC/RHC today.  We will also give lasix 40 mg IV bid as she is volume overloaded.  [facility] laboratory work up revealed mildly elevated ESR  and CRP.  HIV negative. RF mildly elevated and other autoimmune labs are pending.  # Bradycardia: Etiology unclear.  May be related to her RV dysfunction.  Given the lack of wall motion abnormalities on echo, RCA stenosis seems less likely.  LHC/RHC as above.  Avoiding nodal agents and no more dopamine, as she developed ventricular bigeminy, nausea and vomiting with this.  Time spent: 30 minutes-Greater than 50% of this time was  spent in counseling, explanation of diagnosis, planning of further management, and coordination of care.  Reece Fehnel C. Oval Linsey, MD, [facility] 01/23/2016 8:53 AM

## 2016-01-23 NOTE — Interval H&P Note (Signed)
History and Physical Interval Note:  01/23/2016 1:51 PM  Bonnie Krause  has presented today for cardiac cath with the diagnosis of elevated troponin, shock, pulmonary hypertension  The various methods of treatment have been discussed with the patient and family. After consideration of risks, benefits and other options for treatment, the patient has consented to  Procedure(s): Right/Left Heart Cath and Coronary Angiography (N/A) as a surgical intervention .  The patient's history has been reviewed, patient examined, no change in status, stable for surgery.  I have reviewed the patient's chart and labs.  Questions were answered to the patient's satisfaction.  Cath Lab Visit (complete for each Cath Lab visit)  Clinical Evaluation Leading to the Procedure:   ACS: Yes.    Non-ACS:    Anginal Classification: CCS III  Anti-ischemic medical therapy: No Therapy  Non-Invasive Test Results: No non-invasive testing performed  Prior CABG: No previous CABG            [name]

## 2016-01-23 NOTE — Progress Notes (Signed)
DANTICOAGULATION CONSULT NOTE Pharmacy Consult for heparin per pharmacy Indication: chest pain/ACS / DVT  Allergies  Allergen Reactions  . Penicillins Anaphylaxis and Hives    Patient Measurements: Height: 5\' 4"  (162.6 cm) Weight: 269 lb (122.018 kg) IBW/kg (Calculated) : 54.7 Heparin Dosing Weight: 85 kg  Vital Signs: Temp: 98.3 F (36.8 C) (07/14 1159) Temp Source: Oral (07/14 1159) BP: 117/75 mmHg (07/14 1725) Pulse Rate: 49 (07/14 1725)  Labs:  Recent Labs  01/21/16 0454  01/21/16 2042 01/22/16 0246  01/22/16 0537 01/22/16 1312 01/22/16 1616 01/23/16 0212  HGB 10.9*  --   --   --   --  11.2*  --   --  11.2*  HCT 31.4*  --   --   --   --  34.5*  --   --  36.7  PLT 143*  --   --   --   --  166  --   --  196  HEPARINUNFRC 0.44  --   --   --   --   --  0.40  --  0.33  CREATININE 2.98*  --   --  1.91*  --  1.72*  --   --  1.22*  CKTOTAL  --   --  48  --   --   --   --   --   --   TROPONINI  --   < >  --   --   < > 1.28* 0.71* 0.78*  --   < > = values in this interval not displayed.  Estimated Creatinine Clearance: 71.9 mL/min (by C-G formula based on Cr of 1.22).   Assessment: 50 yo female admitted with n/v/d, began having chest pain on 7/11 and had another episode yesterday morning.  ECHO completed 7/12 showed elevated PA pressure and R heart stain.  Pt was admitted with AKI, therefore cath was deferred until renal fx improved and until pt became more hemodynamically stable. SCr is downtrending nicely 7.5 > 1.2, eCrCl 70 ml/min. CBC stable. Heparin level is therapeutic, but downtrending. V/Q scan negative for PE, Dopplers show small segmental DVT in R leg.   Goal of Therapy:  Heparin level 0.3-0.7 units/ml Monitor platelets by anticoagulation protocol: Yes    Plan:  Heparin to resume 8 hours post sheath removal --> midnight Resume heparin at 1100 units / hr 8 hr heparin level Daily heparin level, CBC  Thank you Okey RegalLisa Kenyan Karnes,  PharmD [phone]   01/23/2016,5:49 PM

## 2016-01-23 NOTE — Progress Notes (Signed)
eLink Physician-Brief Progress Note Patient Name: Bonnie Krause DOB: Dec 04, 1965 MRN: [id number]   Date of Service  01/23/2016  HPI/Events of Note  rn call re: diet order. Doing ok post lhc.   eICU Interventions  Diet as tolerated     Intervention Category Minor Interventions: Other:  Daneen SchickJose Angelo A De Dios 01/23/2016, 7:20 PM

## 2016-01-23 NOTE — Progress Notes (Addendum)
DANTICOAGULATION CONSULT NOTE Pharmacy Consult for heparin per pharmacy Indication: chest pain/ACS  Allergies  Allergen Reactions  . Penicillins Anaphylaxis and Hives    Patient Measurements: Height: 5\' 4"  (162.6 cm) Weight: 269 lb (122.018 kg) IBW/kg (Calculated) : 54.7 Heparin Dosing Weight: 85 kg  Vital Signs: Temp: 97.9 F (36.6 C) (07/14 0333) Temp Source: Axillary (07/14 0333) BP: 99/59 mmHg (07/14 0700) Pulse Rate: 43 (07/14 0700)  Labs:  Recent Labs  01/21/16 0454  01/21/16 2042 01/22/16 0246  01/22/16 0537 01/22/16 1312 01/22/16 1616 01/23/16 0212  HGB 10.9*  --   --   --   --  11.2*  --   --  11.2*  HCT 31.4*  --   --   --   --  34.5*  --   --  36.7  PLT 143*  --   --   --   --  166  --   --  196  HEPARINUNFRC 0.44  --   --   --   --   --  0.40  --  0.33  CREATININE 2.98*  --   --  1.91*  --  1.72*  --   --  1.22*  CKTOTAL  --   --  48  --   --   --   --   --   --   TROPONINI  --   < >  --   --   < > 1.28* 0.71* 0.78*  --   < > = values in this interval not displayed.  Estimated Creatinine Clearance: 71.9 mL/min (by C-G formula based on Cr of 1.22).   Assessment: 50 yo female admitted with n/v/d, began having chest pain on 7/11 and had another episode yesterday morning.  ECHO completed 7/12 showed elevated PA pressure and R heart stain.  Pt was admitted with AKI, therefore cath was deferred until renal fx improved and until pt became more hemodynamically stable. SCr is downtrending nicely 7.5 > 1.2, eCrCl 70 ml/min. CBC stable. Heparin level is therapeutic, but downtrending. V/Q scan negative for PE, Dopplers show small segmental DVT in R leg.   Goal of Therapy:  Heparin level 0.3-0.7 units/ml Monitor platelets by anticoagulation protocol: Yes    Plan:  -Continue heparin at 1100 units/hr -Daily HL, CBC -F/u plans for cath    Baldemar FridayMasters, Bawi Lakins M 01/23/2016,7:39 AM

## 2016-01-23 NOTE — H&P (View-Only) (Signed)
PATIENT ID: Bonnie Krause is a 29F with hypertension, hyperlipidemia, obesity, chronic diastolic heart failure, and CKD admitted with 2 weeks of diarrhea and found to have acute renal failure, mildly elevated troponin and bradycardia.  Echo revealed severe pulmonary hypertension (PASP 114 mmHg) with a  Severely dilated and moderately hypokinetic RV, normal LVEF and grade 2 diastolic dysfunction.    INTERVAL HISTORY:  No events overnight.    SUBJECTIVE:  Feeling better.  Able to lay flat.  Notes LE edema   PHYSICAL EXAM Filed Vitals:   01/23/16 0730 01/23/16 0744 01/23/16 0800 01/23/16 0830  BP: 110/68  123/74 126/77  Pulse: 48  47 78  Temp:  97.7 F (36.5 C)    TempSrc:  Oral    Resp: '16  18 18  '$ Height:      Weight:      SpO2: 91%  99% 95%   General:  Well-appearing.  No acute distress. Neck: JVP difficult to assess.  Appears to be above clavicle sitting upright Lungs:  CTAB Heart:  RRR.  No m/r/g.  Distant heart sounds Abdomen:  Soft, NT, ND.  +BS Extremities:  WWP.  1+ pitting edema.  LABS: Lab Results  Component Value Date   TROPONINI 0.78* 01/22/2016   Results for orders placed or performed during the hospital encounter of 01/19/16 (from the past 24 hour(s))  Troponin I     Status: Abnormal   Collection Time: 01/22/16  1:12 PM  Result Value Ref Range   Troponin I 0.71 (HH) <0.03 ng/mL  Heparin level (unfractionated)     Status: None   Collection Time: 01/22/16  1:12 PM  Result Value Ref Range   Heparin Unfractionated 0.40 0.30 - 0.70 IU/mL  HIV antibody     Status: None   Collection Time: 01/22/16  4:16 PM  Result Value Ref Range   HIV Screen 4th Generation wRfx Non Reactive Non Reactive  Troponin I     Status: Abnormal   Collection Time: 01/22/16  4:16 PM  Result Value Ref Range   Troponin I 0.78 (HH) <0.03 ng/mL  Heparin level (unfractionated)     Status: None   Collection Time: 01/23/16  2:12 AM  Result Value Ref Range   Heparin Unfractionated 0.33 0.30 -  0.70 IU/mL  CBC     Status: Abnormal   Collection Time: 01/23/16  2:12 AM  Result Value Ref Range   WBC 10.1 4.0 - 10.5 K/uL   RBC 4.08 3.87 - 5.11 MIL/uL   Hemoglobin 11.2 (L) 12.0 - 15.0 g/dL   HCT 36.7 36.0 - 46.0 %   MCV 90.0 78.0 - 100.0 fL   MCH 27.5 26.0 - 34.0 pg   MCHC 30.5 30.0 - 36.0 g/dL   RDW 15.6 (H) 11.5 - 15.5 %   Platelets 196 150 - 400 K/uL  Basic metabolic panel     Status: Abnormal   Collection Time: 01/23/16  2:12 AM  Result Value Ref Range   Sodium 144 135 - 145 mmol/L   Potassium 4.0 3.5 - 5.1 mmol/L   Chloride 106 101 - 111 mmol/L   CO2 32 22 - 32 mmol/L   Glucose, Bld 111 (H) 65 - 99 mg/dL   BUN 36 (H) 6 - 20 mg/dL   Creatinine, Ser 1.22 (H) 0.44 - 1.00 mg/dL   Calcium 8.3 (L) 8.9 - 10.3 mg/dL   GFR calc non Af Amer 51 (L) >60 mL/min   GFR calc Af Amer 59 (L) >60  mL/min   Anion gap 6 5 - 15  Magnesium     Status: Abnormal   Collection Time: 01/23/16  2:12 AM  Result Value Ref Range   Magnesium 2.5 (H) 1.7 - 2.4 mg/dL  Phosphorus     Status: Abnormal   Collection Time: 01/23/16  2:12 AM  Result Value Ref Range   Phosphorus 2.1 (L) 2.5 - 4.6 mg/dL    Intake/Output Summary (Last 24 hours) at 01/23/16 0853 Last data filed at 01/23/16 0800  Gross per 24 hour  Intake   2714 ml  Output   2000 ml  Net    714 ml    Telemertry:  Sinus rhythm, sinus bradycardia, sinus tachycardia, ventricular bigeminy, PVCs  Echo 01/21/16: Study Conclusions  - Left ventricle: The cavity size was normal. Systolic function was  vigorous. The estimated ejection fraction was in the range of 65%  to 70%. Wall motion was normal; there were no regional wall  motion abnormalities. Features are consistent with a pseudonormal  left ventricular filling pattern, with concomitant abnormal  relaxation and increased filling pressure (grade 2 diastolic  dysfunction). Doppler parameters are consistent with elevated  ventricular end-diastolic filling pressure. -  Ventricular septum: The contour showed diastolic flattening and  systolic flattening. - Aortic valve: There was mild regurgitation. - Mitral valve: Calcified annulus. Mildly thickened leaflets .  There was mild regurgitation. - Right ventricle: The cavity size was severely dilated. Wall  thickness was normal. Systolic function was moderately reduced. - Right atrium: The atrium was severely dilated. - Tricuspid valve: There was moderate regurgitation. - Pulmonary arteries: Systolic pressure was severely increased. PA  peak pressure: 114 mm Hg (S).  Impressions:  - LVEF normal.  Grade 2 diastolic dysfunction with elevated filling pressures.   Severely dilated right sided chambers with severely elevated RVSP  out of proportion to the LV diastolic dysfunction.  Suspect pulmonary etiology, ? pulmonary embolism.  ASSESSMENT AND PLAN:  Principal Problem:   Nausea, vomiting, and diarrhea Active Problems:   Chest pain   Essential hypertension   HLD (hyperlipidemia)   Hypothyroidism   Chronic CHF (congestive heart failure) (HCC)   Yeast infection   Abdominal pain   Hypotension   OSA (obstructive sleep apnea)   Shock circulatory (HCC)   Acute renal failure (ARF) (HCC)   Hyperkalemia   Bradycardia   Elevated troponin   EKG abnormalities   NSTEMI (non-ST elevated myocardial infarction) ([place])   Pulmonary hypertension (HCC)   # Severe pulmonary hypertension:  # Systemic hypotension: Echo revealed RV dysfunction and severely elevated pulmonary pressures.  V/Q scan was negative for PE, though it appears she has a small R popliteal DVT.  She has been treated for OSA so this seems less likely to be the cause.  LV revealed grade 2 diastolic dysfunction with normal systolic function.  Renal function has improved significantly so we will plan for LHC/RHC today.  We will also give lasix 40 mg IV bid as she is volume overloaded.  [place] laboratory work up revealed mildly elevated ESR  and CRP.  HIV negative. RF mildly elevated and other autoimmune labs are pending.  # Bradycardia: Etiology unclear.  May be related to her RV dysfunction.  Given the lack of wall motion abnormalities on echo, RCA stenosis seems less likely.  LHC/RHC as above.  Avoiding nodal agents and no more dopamine, as she developed ventricular bigeminy, nausea and vomiting with this.  Time spent: 30 minutes-Greater than 50% of this time was  spent in counseling, explanation of diagnosis, planning of further management, and coordination of care.  Elsworth Ledin C. Oval Linsey, MD, [facility] 01/23/2016 8:53 AM

## 2016-01-23 NOTE — Progress Notes (Signed)
PULMONARY / CRITICAL CARE MEDICINE   Name: Bonnie Krause MRN: [id number] DOB: 02-23-1966    ADMISSION DATE:  01/19/2016 CONSULTATION DATE:  01/19/16  REFERRING MD:  Blaine Hamper [facility])  CHIEF COMPLAINT:  N/V/D, hypotension  HISTORY OF PRESENT ILLNESS:   Bonnie Krause is a 50 y.o. female with PMH as outlined below. She was seen by PCP 7/10 for N/V/D, abd pain, generalized weakness x 3 - 4 weeks.  At PCP office, she was found to be hypotensive so was sent to [facility] for further evaluation.  At [facility], she had BP in 70's and was found to have acute renal failure with SCr 9.50.  She was transferred to [facility] for further evaluation and management and was admitted by [facility] team.  PCCM was called later that evening for persistent hypotension with SBP in 70's - 80's.  Of note, she has remained awake with normal mental status since admission and state she occasionally has low BP but is unable to tell me what her pressures typically run.  She states N/V/D have been going on for roughly 3 - 4 weeks and have been associated with abd pain.  Diarrhea has been as many as 8 times per night, vomiting 2 - 3 times per day.  She has not tolerated any food as she has never been hungry.  Has been trying to drink as much water as possible, occasionally able to keep it down.  Has had intermittent subjective fevers and chills.  Denies any hematochezia or melena. Has had dull chest tightness / pain with radiation into left arm with numbness / tingling that started at the same time her N/V/D did 3 - 4 weeks ago.  This has not changed since in character or frequency.  Denies lightheadedness, diaphoresis, syncope.  SUBJECTIVE:   Continues to be bradycardic at night  Off pressors Creatinine continues to improve CPAP overnight Intermittent stuttering in speech Patient would like to be DNR, discussed in detail what this means and patient is aware and content with her decision   VITAL SIGNS: BP 118/70 mmHg   Pulse 44  Temp(Src) 97.9 F (36.6 C) (Axillary)  Resp 15  Ht '5\' 4"'$  (1.626 m)  Wt 269 lb (122.018 kg)  BMI 46.15 kg/m2  SpO2 98%  LMP 12/30/2015   INTAKE / OUTPUT: I/O last 3 completed shifts: In: 4653.3 [I.V.:3017.3; Other:330; IV [id number]] Out: 3000 [Urine:3000]   PHYSICAL EXAMINATION: General: Middle aged female, in NAD Neuro: A&O x 3, speech stuttering, CN 2-12 intact, 4/5 strength in b/l UE, unable to assess strength in b/l LE due to pain, sensation grossly intact throughout, no hand tremor, gait not observed HEENT: [place]/AT, PERRL, sclera anicteric. Cardiovascular: Bradycardic rate, regular rhythm, no M/R/G. Faint DP pulses bilaterally Lungs: On 2 L [place], normal work of breathing, CTA bilaterally Abdomen: Morbidly obese. BS x 4, soft, tender without guarding or rebound. Musculoskeletal: No gross deformities, no edema. Positive Homan sign on L  Skin: Intact, warm, no rashes, lower ext ecchymosis and chronic hyperpigmentation   LABS:  BMET  Recent Labs Lab 01/22/16 0246 01/22/16 0537 01/23/16 0212  NA 146* 147* 144  K 3.3* 3.5 4.0  CL 107 106 106  CO2 30 30 32  BUN 71* 67* 36*  CREATININE 1.91* 1.72* 1.22*  GLUCOSE 104* 92 111*    Electrolytes  Recent Labs Lab 01/21/16 0454 01/22/16 0246 01/22/16 0537 01/23/16 0212  CALCIUM 7.8* 8.0* 8.0* 8.3*  MG 1.4* 1.4* 1.9 2.5*  PHOS 5.5*  --  2.7 2.1*    CBC  Recent Labs Lab 01/21/16 0454 01/22/16 0537 01/23/16 0212  WBC 3.8* 8.6 10.1  HGB 10.9* 11.2* 11.2*  HCT 31.4* 34.5* 36.7  PLT 143* 166 196    Coag's  Recent Labs Lab 01/19/16 2253  APTT 31  INR 1.35    Sepsis Markers  Recent Labs Lab 01/19/16 2253 01/19/16 2259 01/20/16 0219  LATICACIDVEN  --  0.9 0.5  PROCALCITON 0.78  --   --     ABG No results for input(s): PHART, PCO2ART, PO2ART in the last 168 hours.  Liver Enzymes  Recent Labs Lab 01/19/16 2253 01/20/16 0219  AST 11* 10*  ALT 13* 12*  ALKPHOS 52 48  BILITOT  0.9 0.9  ALBUMIN 3.2* 3.0*    Cardiac Enzymes  Recent Labs Lab 01/22/16 0537 01/22/16 1312 01/22/16 1616  TROPONINI 1.28* 0.71* 0.78*    Glucose No results for input(s): GLUCAP in the last 168 hours.  HIV negative   Imaging Nm Pulmonary Perf And Vent  01/22/2016  CLINICAL DATA:  Severe pulmonary hypertension. Congestive heart failure. EXAM: NUCLEAR MEDICINE VENTILATION - PERFUSION LUNG SCAN TECHNIQUE: Ventilation images were obtained in multiple projections using inhaled aerosol Tc-45m DTPA. Perfusion images were obtained in multiple projections after intravenous injection of Tc-42m MAA. RADIOPHARMACEUTICALS:  31.5 mCi Technetium-25m DTPA aerosol inhalation and 4.2 mCi Technetium-44m MAA IV COMPARISON:  3 chest radiograph 01/22/2016 compared with the FINDINGS: Ventilation: Mild heterogeneity of ventilation. Decreased ventilation to the RIGHT lower lobe corresponds to elevated hemidiaphragm. Perfusion: No wedge-shaped peripheral perfusion defects. Decreased ventilation to the RIGHT lower lobe corresponds to the elevated RIGHT hemidiaphragm. IMPRESSION: No evidence of pulmonary embolism. Electronically Signed   By: Genevive Bi M.D.   On: 01/22/2016 11:00     STUDIES:  CT A / P 7/10 > extensive diverticulosis of sigmoid without diverticulitis.  Suspect colitis of infectious or inflammatory nature of ascending colon and cecum.  Lymph nodes likely reactive.  37mm calculus within porta hepatis, suspicious for small stone within gallbladder neck or cystic duct.  No evidence of cholecystitis. 7/10 renal US negative 7/12 Echo: EF 65 to 70%, grade 2 diastolic CHF, mild AR, mild MR, severe RV dilation, mod TR, PAS 114 mmHg 7/13 V/Q scan: normal 7/13 B/l LE doppler U/S: small segment of DVT in the R popliteal v.   CULTURES: Stool GI PCR 7/10 > C.diff PCR 7/10 > Blood 7/10 >  ANTIBIOTICS: Cipro 7/10 >7/13 Flagyl 7/10 >7/13  SIGNIFICANT EVENTS: 7/10 > transferred to [facility] with  hypotension and acute renal failure. 7/11 inferior ekg changes  LINES/TUBES: Left radial a line 7/10 >7/12  DISCUSSION: 50 y.o. F transferred from [place] with N/V/D and abd pain x 3 -4 weeks, acute renal failure, hypotension. PCCM called for assistance evening of 7/10. 7/12 creatine continues to improve. May be ready to go to SDU after heart cath.   ASSESSMENT / PLAN:  CARDIOVASCULAR A:  Hypotension- suspect hypovolemic due to decreased PO intake + N/V/D x 3 - 4 weeks- Improving Bradycardia- Intermittent Chest pain- Resolved Abnormal Echo from 7/12- Severe Pulmonary Hypertension and R Heart Strain Elevated Troponins, likely demand ischemia  Hx HLD, dCHF, chronic venous insufficiency    P:  Continue fluids  Solucortef for adrenal insuff, start weaning 7/12 Nitro PRN for chest pain Cardiology following, appreciate their assistance Cardiac cath today Consider CVL placement  Continue outpatient atorvastatin, aspirin Hold outpatient lisinopril, spironolactone, furosemide Continue Heparin drip   RENAL A:   AKI- Resolving  AG + NAG metabolic acidosis - uremia + diarrhea- Resolved Hypocalcemia  Hypophosphatemia Hypokalemia- Resolved Hypomagnesemia- Resolved P:   Normal saline @ 1/2 maintenance Follow lytes Renal has signed off as Cr has been improving Replete electrolytes as needed  GASTROINTESTINAL A:   Nausea and Vomiting- Intermittent Colitis- Resolved  Diverticulosis Nutrition P:   Clear liquid diet, NPO since midnight for heart cath today Zofran and Phenergan PRN for N/V  PULMONARY A: Acute hypoxic respiratory failure- On 2 L [place] during daytime OSA with probable OHS - on nocturnal CPAP Suspicion for PE- V/Q scan normal but Dopplers revealed small segment of DVT in the R popliteal v. P:   Continue supplemental O2 as needed to maintain SpO2 > 92%. Pulmonary hygiene Continue nocturnal CPAP CXR intermittently; CXR from 7/13 normal Heparin  ggt  HEMATOLOGIC A:   DVT in R. Popliteal v.  Concern for autoimmune process: Mildly elevated CRP and ESR P:  SCD's  Heparin drip  Daily CBCs F/u autoimmune workup (ANA, ANCA, Anti-scl, ACA, Anti-CCP, Anti-smith, RF)   INFECTIOUS A:   Colitis- Resolved. S/p 3 day treatment with Flagyl and Cipro P:   Continue to monitor for fever  ENDOCRINE A:   Hypothyroidism: TSH normal at 2.2 on 7/11 P:   Continue outpatient synthroid.  NEUROLOGIC A:   New Speech stuttering that started yesterday evening: likely anxiety. No concerning neurological deficits that would suggest stroke Decreased B/L UE strength P:   Continue to monitor neurological status, if patient develops any focal neurological deficits can consider CT Head/MRI brain  Family updated: None available.  Interdisciplinary Family Meeting v Palliative Care Meeting:  Due by: 7/17.   Smitty Cords, MD [place], PGY-2  Attending note: Denies chest pain, nausea.  Breathing better.  Had episode of stuttering yesterday >> none since.  Alert.  Normal strength, CN intact, no tremor, speech normal.  HR regular.  No wheeze.  Abd soft.  1+edema.  Creatinine 1.22, WBC 10.1.  Assessment/plan: Chest pain, bradycardia, pulmonary HTN. - for Lt and Rt heart cath  Small DVT Rt popliteal vein. - continue heparin gtt  AKI >> resolved.  OSA. Elevated Rt diaphragm. - auto CPAP qhs  Chesley Mires, MD [place] 01/23/2016, 2:10 PM Pager:  [phone] After 3pm call: [phone]

## 2016-01-23 NOTE — Progress Notes (Signed)
7Fr right venous sheath aspirated and removed.  Pressure held for 10mins.  Site level 0.  Gauze and tegaderm dressing placed over site.  Instructions given to pt.  Pt verbalized understanding. VSS.   Bedrest begins at 4:05pm  Edgardo RoysMcGrath, Congetta Odriscoll R

## 2016-01-24 ENCOUNTER — Inpatient Hospital Stay (HOSPITAL_COMMUNITY): Payer: PRIVATE HEALTH INSURANCE

## 2016-01-24 DIAGNOSIS — I82431 Acute embolism and thrombosis of right popliteal vein: Secondary | ICD-10-CM

## 2016-01-24 LAB — CBC
HCT: 37.1 % (ref 36.0–46.0)
Hemoglobin: 11.2 g/dL — ABNORMAL LOW (ref 12.0–15.0)
MCH: 27.9 pg (ref 26.0–34.0)
MCHC: 30.2 g/dL (ref 30.0–36.0)
MCV: 92.3 fL (ref 78.0–100.0)
PLATELETS: 164 10*3/uL (ref 150–400)
RBC: 4.02 MIL/uL (ref 3.87–5.11)
RDW: 15.5 % (ref 11.5–15.5)
WBC: 12.5 10*3/uL — AB (ref 4.0–10.5)

## 2016-01-24 LAB — CULTURE, BLOOD (ROUTINE X 2)
CULTURE: NO GROWTH
Culture: NO GROWTH

## 2016-01-24 LAB — HEPARIN LEVEL (UNFRACTIONATED)
Heparin Unfractionated: <0.1 [IU]/mL — ABNORMAL LOW (ref 0.30–0.70)
Heparin Unfractionated: 0.47 IU/mL (ref 0.30–0.70)

## 2016-01-24 LAB — BASIC METABOLIC PANEL
ANION GAP: 9 (ref 5–15)
BUN: 26 mg/dL — ABNORMAL HIGH (ref 6–20)
CALCIUM: 8.4 mg/dL — AB (ref 8.9–10.3)
CO2: 29 mmol/L (ref 22–32)
Chloride: 107 mmol/L (ref 101–111)
Creatinine, Ser: 1.14 mg/dL — ABNORMAL HIGH (ref 0.44–1.00)
GFR, EST NON AFRICAN AMERICAN: 56 mL/min — AB (ref >60)
Glucose, Bld: 99 mg/dL (ref 65–99)
POTASSIUM: 4 mmol/L (ref 3.5–5.1)
Sodium: 145 mmol/L (ref 135–145)

## 2016-01-24 MED ORDER — KETOROLAC TROMETHAMINE 30 MG/ML IJ SOLN
30.0000 mg | Freq: Once | INTRAMUSCULAR | Status: AC
  Administered 2016-01-24: 30 mg via INTRAVENOUS
  Filled 2016-01-24: qty 1

## 2016-01-24 MED ORDER — IOPAMIDOL (ISOVUE-370) INJECTION 76%
INTRAVENOUS | Status: AC
  Administered 2016-01-24: 10:00:00
  Filled 2016-01-24: qty 100

## 2016-01-24 NOTE — Progress Notes (Signed)
PULMONARY / CRITICAL CARE MEDICINE   Name: Bonnie Krause MRN: [id number] DOB: 1965/11/03    ADMISSION DATE:  01/19/2016 CONSULTATION DATE:  01/19/16  REFERRING MD:  Clyde LundborgNiu [facility]  CHIEF COMPLAINT:  N/V/D, hypotension  HISTORY OF PRESENT ILLNESS:   Bonnie Krause is a 50 y.o. female with PMH as outlined below. She was seen by PCP 7/10 for N/V/D, abd pain, generalized weakness x 3 - 4 weeks.  At PCP office, she was found to be hypotensive so [facility].  [facility], she had BP in 70's and was found to have acute renal failure with SCr 9.50.  [facility] and [facility] and [facility].  PCCM was called later that evening for persistent hypotension with SBP in 70's - 80's.  Of note, she has remained awake with normal mental status since admission and state she occasionally has low BP but is unable to tell me what her pressures typically run.  She states N/V/D have been going on for roughly 3 - 4 weeks and have been associated with abd pain.  Diarrhea has been as many as 8 times per night, vomiting 2 - 3 times per day.  She has not tolerated any food as she has never been hungry.  Has been trying to drink as much water as possible, occasionally able to keep it down.  Has had intermittent subjective fevers and chills.  Denies any hematochezia or melena. Has had dull chest tightness / pain with radiation into left arm with numbness / tingling that started at the same time her N/V/D did 3 - 4 weeks ago.  This has not changed since in character or frequency.  Denies lightheadedness, diaphoresis, syncope.  SUBJECTIVE:   C/o pain in Lt foot last night >> better this AM.  Denies chest pain.  Breathing better.  VITAL SIGNS: BP 86/62 mmHg  Pulse 63  Temp(Src) 97.6 F (36.4 C) (Oral)  Resp 15  Ht 5\' 4"  (1.626 m)  Wt 269 lb (122.018 kg)  BMI 46.15 kg/m2  SpO2 95%  LMP 12/30/2015  INTAKE /  OUTPUT: I/O last 3 completed shifts: In: 2883.3 [P.O.:240; I.V.:2453.3; Other:190] Out: 3225 [Urine:3225]   PHYSICAL EXAMINATION: General: alert Neuro: normal strength HEENT: no stridor Cardiac: regular, no murmur Chest: no wheeze Abd: soft, non tender Ext: 1+ edema  Skin: no rashes  LABS:  BMET  Recent Labs Lab 01/22/16 0537 01/23/16 0212 01/24/16 0220  NA 147* 144 145  K 3.5 4.0 4.0  CL 106 106 107  CO2 30 32 29  BUN 67* 36* 26*  CREATININE 1.72* 1.22* 1.14*  GLUCOSE 92 111* 99    Electrolytes  Recent Labs Lab 01/21/16 0454 01/22/16 0246 01/22/16 0537 01/23/16 0212 01/24/16 0220  CALCIUM 7.8* 8.0* 8.0* 8.3* 8.4*  MG 1.4* 1.4* 1.9 2.5*  --   PHOS 5.5*  --  2.7 2.1*  --     CBC  Recent Labs Lab 01/22/16 0537 01/23/16 0212 01/24/16 0220  WBC 8.6 10.1 12.5*  HGB 11.2* 11.2* 11.2*  HCT 34.5* 36.7 37.1  PLT 166 196 164    Coag's  Recent Labs Lab 01/19/16 2253  APTT 31  INR 1.35    Sepsis Markers  Recent Labs Lab 01/19/16 2253 01/19/16 2259 01/20/16 0219  LATICACIDVEN  --  0.9 0.5  PROCALCITON 0.78  --   --     ABG No results for input(s): PHART,  PCO2ART, PO2ART in the last 168 hours.  Liver Enzymes  Recent Labs Lab 01/19/16 2253 01/20/16 0219  AST 11* 10*  ALT 13* 12*  ALKPHOS 52 48  BILITOT 0.9 0.9  ALBUMIN 3.2* 3.0*    Cardiac Enzymes  Recent Labs Lab 01/22/16 0537 01/22/16 1312 01/22/16 1616  TROPONINI 1.28* 0.71* 0.78*    Glucose No results for input(s): GLUCAP in the last 168 hours.  Imaging No results found.   STUDIES:  7/10 renal US negative 02/08/2023 Echo: EF 65 to 70%, grade 2 diastolic CHF, mild AR, mild MR, severe RV dilation, mod TR, PAS 114 mmHg 7/13 V/Q scan: normal 7/13 B/l LE doppler U/S: small segment of DVT in the R popliteal v. 7/14 Lt and Rt heart cath >> no CAD; elevated PA pressures  CULTURES: Blood 7/10 >  ANTIBIOTICS: Cipro 7/10 >7/13 Flagyl 7/10 >7/13  SIGNIFICANT  EVENTS: 7/10 transferred to [facility] with hypotension and acute renal failure. 7/11 inferior ekg changes 7/14 heart cath  LINES/TUBES: Left radial a line 7/10 >7/12  DISCUSSION: 50 yo female with n/v/d and AKI.  Developed chest pain, ECG changes, bradycardia hypotension.  Found to have severe pulmonary HTN.  ASSESSMENT / PLAN:  CARDIOVASCULAR A:  Hypotension 2nd to hypovolemia >> improved. Chest pain >> no CAD on cath. Bradycardia. Hx of HLD.  Pulmonary HTN. Rt leg DVT. P:  Hold diuresis Monitor hemodynamics Continue outpatient atorvastatin, aspirin Hold outpatient lisinopril, spironolactone, furosemide Continue Heparin drip  CT angiogram chest 7/15  RENAL A:   AKI >> resolved. Metabolic acidosis >> resolved. P:   Monitor renal fx, urine outpt  GASTROINTESTINAL A:   Nausea >> improved. P:   Advance diet as tolerated  PULMONARY A: Pulmonary HTN. Hx of OSA. Rt diaphragm elevation. P:   F/u serology CPAP qhs Oxygen to keep SpO2 > 92%  HEMATOLOGIC A:   No acute issues. P:  F/u CBC  INFECTIOUS A:   Colitis >> resolved. P:   Monitor off Abx  ENDOCRINE A:   Hypothyroidism: TSH normal at 2.2 on 7/11. P:   Continue outpatient synthroid.  NEUROLOGIC A:   Lt foot pain. P:   Prn pain meds   Coralyn Helling, MD [facility] Pulmonary/Critical Care 01/24/2016, 8:56 AM Pager:  [phone] After 3pm call: [phone]

## 2016-01-24 NOTE — Progress Notes (Signed)
Subjective:  Complains of mild nausea and mild shortness of breath.  Currently extubated.  Creatinine stable post cath.  Cath showed no significant coronary disease but severe pulmonary hypertension.  The CT scan had extravasation of contrast into her arm and was felt to be nondiagnostic so will be repeated.  Objective:  Vital Signs in the last 24 hours: BP 93/77 mmHg  Pulse 75  Temp(Src) 98.3 F (36.8 C) (Axillary)  Resp 17  Ht 5\' 4"  (1.626 m)  Wt 122.018 kg (269 lb)  BMI 46.15 kg/m2  SpO2 98%  LMP 12/30/2015  Physical Exam: Very large white female in bed in no acute distress Skin somewhat mottled Lungs:  Clear  Cardiac:  Regular rhythm, normal S1 and S2, no S3 Extremities:  Radial cath site clean and dry   Intake/Output from previous day: 07/14 0701 - 07/15 0700 In: 1763.3 [P.O.:240; I.V.:1453.3] Out: 2475 [Urine:2475] Weight Filed Weights   01/20/16 0100  Weight: 122.018 kg (269 lb)    Lab Results: Basic Metabolic Panel:  Recent Labs  32/95/1807/14/17 0212 01/24/16 0220  NA 144 145  K 4.0 4.0  CL 106 107  CO2 32 29  GLUCOSE 111* 99  BUN 36* 26*  CREATININE 1.22* 1.14*    CBC:  Recent Labs  01/22/16 0537 01/23/16 0212 01/24/16 0220  WBC 8.6 10.1 12.5*  NEUTROABS 3.5  --   --   HGB 11.2* 11.2* 11.2*  HCT 34.5* 36.7 37.1  MCV 87.1 90.0 92.3  PLT 166 196 164    BNP    Component Value Date/Time   BNP 467.4* 01/19/2016 2253    Cardiac Panel (last 3 results)  Recent Labs  01/21/16 2042  01/22/16 0537 01/22/16 1312 01/22/16 1616  CKTOTAL 48  --   --   --   --   TROPONINI  --   < > 1.28* 0.71* 0.78*  < > = values in this interval not displayed.  PROTIME: Lab Results  Component Value Date   INR 1.35 01/19/2016    Telemetry:Sinus rhythm  Assessment/Plan:  1.  Severe pulmonary hypertension with right ventricular dysfunction-at this point it looks like primary pulmonary hypertension but an evaluation to rule out PE is still in progress 2.  No  significant CAD 3.  Obesity 4.  History of sleep apnea  Recommendations:  We'll have primary team check again on Monday.  Await results of CT scan.     Darden PalmerW. Spencer Calie Buttrey, Jr.  MD [facility] Cardiology  01/24/2016, 1:38 PM

## 2016-01-24 NOTE — Progress Notes (Signed)
Pt had extravasation of IV contrast to the RAC.  During increased pulmonary pressures of PE study, extravasation amount is estimated to be  50% of the 80ml of IV contrast injected. Radiologist discussed care and treatment with pt and ICU nurse. Pt stated no pain at site. Instruction for care is provided to pt and nurse by Radiologist.

## 2016-01-24 NOTE — Progress Notes (Signed)
DANTICOAGULATION CONSULT NOTE  Pharmacy Consult for heparin per pharmacy Indication:  DVT, r/o PE  Allergies  Allergen Reactions  . Penicillins Anaphylaxis and Hives    Patient Measurements: Height: 5\' 4"  (162.6 cm) Weight: 269 lb (122.018 kg) IBW/kg (Calculated) : 54.7 Heparin Dosing Weight: 85 kg  Vital Signs: Temp: 98.5 F (36.9 C) (07/15 2021) Temp Source: Oral (07/15 2021) BP: 106/73 mmHg (07/15 1800) Pulse Rate: 67 (07/15 1600)  Labs:  Recent Labs  01/22/16 0537  01/22/16 1312 01/22/16 1616 01/23/16 0212 01/24/16 0220 01/24/16 0826 01/24/16 2026  HGB 11.2*  --   --   --  11.2* 11.2*  --   --   HCT 34.5*  --   --   --  36.7 37.1  --   --   PLT 166  --   --   --  196 164  --   --   HEPARINUNFRC  --   < > 0.40  --  0.33  --  <0.10* 0.47  CREATININE 1.72*  --   --   --  1.22* 1.14*  --   --   TROPONINI 1.28*  --  0.71* 0.78*  --   --   --   --   < > = values in this interval not displayed.  Estimated Creatinine Clearance: 76.9 mL/min (by C-G formula based on Cr of 1.14).  Assessment: 50 yo female admitted with n/v/d, began having chest pain on 7/11 and had another episode yesterday morning.  ECHO completed 7/12 showed elevated PA pressure and R heart stain. She continues on heparin gtt s/p cardiac cath for DVT and possible PE.  -Heparin level is  at goal on 1300 units/hr (HL= 0.47)  Goal of Therapy:  Heparin level 0.3-0.7 units/ml Monitor platelets by anticoagulation protocol: Yes  Plan:   -No heparin changes needed -Heparin level and CBC daily  Harland Germanndrew Haliegh Khurana, Pharm D 01/24/2016 9:25 PM

## 2016-01-24 NOTE — Progress Notes (Signed)
DANTICOAGULATION CONSULT NOTE  Pharmacy Consult for heparin per pharmacy Indication:  DVT, r/o PE  Allergies  Allergen Reactions  . Penicillins Anaphylaxis and Hives    Patient Measurements: Height: 5\' 4"  (162.6 cm) Weight: 269 lb (122.018 kg) IBW/kg (Calculated) : 54.7 Heparin Dosing Weight: 85 kg  Vital Signs: Temp: 97.6 F (36.4 C) (07/15 0832) Temp Source: Oral (07/15 0832) BP: 119/79 mmHg (07/15 0900) Pulse Rate: 50 (07/15 0900)  Labs:  Recent Labs  01/21/16 2042  01/22/16 0537 01/22/16 1312 01/22/16 1616 01/23/16 0212 01/24/16 0220 01/24/16 0826  HGB  --   < > 11.2*  --   --  11.2* 11.2*  --   HCT  --   --  34.5*  --   --  36.7 37.1  --   PLT  --   --  166  --   --  196 164  --   HEPARINUNFRC  --   --   --  0.40  --  0.33  --  <0.10*  CREATININE  --   < > 1.72*  --   --  1.22* 1.14*  --   CKTOTAL 48  --   --   --   --   --   --   --   TROPONINI  --   < > 1.28* 0.71* 0.78*  --   --   --   < > = values in this interval not displayed.  Estimated Creatinine Clearance: 76.9 mL/min (by C-G formula based on Cr of 1.14).  Assessment: 50 yo female admitted with n/v/d, began having chest pain on 7/11 and had another episode yesterday morning.  ECHO completed 7/12 showed elevated PA pressure and R heart stain. She continues on heparin gtt s/p cardiac cath for DVT and possible PE. Heparin level is undetectable today despite being previously therapeutic on this rate. RN verified heparin was not off this morning.   Goal of Therapy:  Heparin level 0.3-0.7 units/ml Monitor platelets by anticoagulation protocol: Yes  Plan:  - Increase heparin gtt to 1300 units/hr - Check an 8 hr heparin level - Daily heparin level and CBC  Lysle Pearlachel Ramin Zoll, PharmD, BCPS Pager # [phone] 01/24/2016 11:12 AM p

## 2016-01-24 NOTE — Progress Notes (Signed)
Interventional Radiology Progress Note  50 yo female in CT for PE study.  Called to evaluate contrast IV extravasation.  PE study for increased pulmonary pressures.    First contrast bolus of 100cc was infused, with non-diagnostic pulmonary CTA acquired.  Second bolus of 80 cc contrast was infused, with extravasation of at least 50% into the right upper arm [facility].    Paitent has no pain at the site.  No induration or redness currently.  Palpable right radial pulse.  Gross motor intact without compromise.  Discussed patient's clinical status with the ICU nursing staff.  Patient has been hemodynamically stable.  She has compromise of GFR, estimated at 56, with Cr of 1.14  Recommendations:  - Removal of IV site.  - New IV will be required - Treat arm with ice pack and elevation.   - Wound advise 24 hours of hydration and new IV site before repeat attempted study, given the renal function and that she received at least 125cc contrast today, and recent cardiac cath.   - Discussed care with ICU nursing staff, and paging Dr. Craige CottaSood of ICU.   Signed,  Yvone NeuJaime S. Loreta AveWagner, DO

## 2016-01-25 LAB — CBC
HEMATOCRIT: 36.6 % (ref 36.0–46.0)
Hemoglobin: 11.1 g/dL — ABNORMAL LOW (ref 12.0–15.0)
MCH: 28.2 pg (ref 26.0–34.0)
MCHC: 30.3 g/dL (ref 30.0–36.0)
MCV: 93.1 fL (ref 78.0–100.0)
Platelets: 154 10*3/uL (ref 150–400)
RBC: 3.93 MIL/uL (ref 3.87–5.11)
RDW: 15.7 % — AB (ref 11.5–15.5)
WBC: 14.1 10*3/uL — ABNORMAL HIGH (ref 4.0–10.5)

## 2016-01-25 LAB — ANCA TITERS
C-ANCA: <1:20 {titer}
P-ANCA: <1:20 {titer}

## 2016-01-25 LAB — BASIC METABOLIC PANEL
Anion gap: 7 (ref 5–15)
BUN: 24 mg/dL — AB (ref 6–20)
CALCIUM: 8.6 mg/dL — AB (ref 8.9–10.3)
CO2: 30 mmol/L (ref 22–32)
Chloride: 104 mmol/L (ref 101–111)
Creatinine, Ser: 1.09 mg/dL — ABNORMAL HIGH (ref 0.44–1.00)
GFR calc Af Amer: >60 mL/min (ref >60)
GFR, EST NON AFRICAN AMERICAN: 59 mL/min — AB (ref >60)
GLUCOSE: 77 mg/dL (ref 65–99)
Potassium: 4.2 mmol/L (ref 3.5–5.1)
Sodium: 141 mmol/L (ref 135–145)

## 2016-01-25 LAB — HEPARIN LEVEL (UNFRACTIONATED): Heparin Unfractionated: 0.49 IU/mL (ref 0.30–0.70)

## 2016-01-25 MED ORDER — SODIUM CHLORIDE 0.9% FLUSH
10.0000 mL | Freq: Two times a day (BID) | INTRAVENOUS | Status: DC
  Administered 2016-01-28 – 2016-01-29 (×4): 10 mL

## 2016-01-25 MED ORDER — SODIUM CHLORIDE 0.9% FLUSH: 10.0000 mL | INTRAVENOUS | Status: DC | PRN

## 2016-01-25 MED ORDER — OXYCODONE-ACETAMINOPHEN 5-325 MG PO TABS
1.0000 | ORAL_TABLET | ORAL | Status: DC | PRN
Start: 2016-01-25 — End: 2016-01-31
  Administered 2016-01-25 (×2): 1 via ORAL
  Administered 2016-01-26: 2 via ORAL
  Administered 2016-01-28: 1 via ORAL
  Administered 2016-01-28 – 2016-01-30 (×4): 2 via ORAL
  Filled 2016-01-25 (×2): qty 1
  Filled 2016-01-25 (×6): qty 2

## 2016-01-25 MED ORDER — ASPIRIN EC 81 MG PO TBEC
81.0000 mg | DELAYED_RELEASE_TABLET | Freq: Every day | ORAL | Status: DC
Start: 2016-01-25 — End: 2016-01-31
  Administered 2016-01-25 – 2016-01-30 (×6): 81 mg via ORAL
  Filled 2016-01-25 (×7): qty 1

## 2016-01-25 NOTE — Progress Notes (Signed)
Pt DTV and stated feeling need to void 7hrs post catheter d/c. No activity restrictions but pt has not been OOB for admission. MD pg'd x 2 with no response. Pt has been on Hep gtt for several dates. [facility], gait belt but unable to pivot due to pain/cramping in left foot. Pt sat on bedpan at edge of bed. Unsuccessful attempt to void. Pt dinner tray at bedside. Will eat and drink more and attempt bedpan in bed after dinner.   Pt would benefit from PT due to foot and generalized weakness. Would also like to possibly re-address DNR status to understand more fully what her options are. She stated she just did it so her mom would not have to make the decision at the time.

## 2016-01-25 NOTE — Progress Notes (Signed)
PULMONARY / CRITICAL CARE MEDICINE   Name: Bonnie Krause MRN: [id number] DOB: 1966-04-01    ADMISSION DATE:  01/19/2016 CONSULTATION DATE:  01/19/16  REFERRING MD:  Clyde Lundborg [facility])  CHIEF COMPLAINT:  N/V/D, hypotension  HISTORY OF PRESENT ILLNESS:   Bonnie Krause is a 50 y.o. female with PMH as outlined below. She was seen by PCP 7/10 for N/V/D, abd pain, generalized weakness x 3 - 4 weeks.  At PCP office, she was found to be hypotensive so was sent to [facility] for further evaluation.  At [facility], she had BP in 70's and was found to have acute renal failure with SCr 9.50.  She was transferred to [facility] for further evaluation and management and was admitted by [facility] team.  PCCM was called later that evening for persistent hypotension with SBP in 70's - 80's.  Of note, she has remained awake with normal mental status since admission and state she occasionally has low BP but is unable to tell me what her pressures typically run.  She states N/V/D have been going on for roughly 3 - 4 weeks and have been associated with abd pain.  Diarrhea has been as many as 8 times per night, vomiting 2 - 3 times per day.  She has not tolerated any food as she has never been hungry.  Has been trying to drink as much water as possible, occasionally able to keep it down.  Has had intermittent subjective fevers and chills.  Denies any hematochezia or melena. Has had dull chest tightness / pain with radiation into left arm with numbness / tingling that started at the same time her N/V/D did 3 - 4 weeks ago.  This has not changed since in character or frequency.  Denies lightheadedness, diaphoresis, syncope.  SUBJECTIVE:   Still has pain in feet.  Denies chest pain.  Breathing better.  VITAL SIGNS: BP 108/76 mmHg  Pulse 57  Temp(Src) 97.3 F (36.3 C) (Oral)  Resp 14  Ht 5\' 4"  (1.626 m)  Wt 269 lb (122.018 kg)  BMI 46.15 kg/m2  SpO2 99%  LMP 12/30/2015  INTAKE / OUTPUT: I/O last 3 completed  shifts: In: 1090.6 [P.O.:240; I.V.:850.6] Out: 2426 [Urine:2425; Stool:1]   PHYSICAL EXAMINATION: General: alert Neuro: normal strength HEENT: no stridor Cardiac: regular, no murmur Chest: no wheeze Abd: soft, non tender Ext: 1+ edema, Rt arm elevated Skin: no rashes  LABS:  BMET  Recent Labs Lab 01/23/16 0212 01/24/16 0220 01/25/16 0240  NA 144 145 141  K 4.0 4.0 4.2  CL 106 107 104  CO2 32 29 30  BUN 36* 26* 24*  CREATININE 1.22* 1.14* 1.09*  GLUCOSE 111* 99 77    Electrolytes  Recent Labs Lab 01/21/16 0454 01/22/16 0246 01/22/16 0537 01/23/16 0212 01/24/16 0220 01/25/16 0240  CALCIUM 7.8* 8.0* 8.0* 8.3* 8.4* 8.6*  MG 1.4* 1.4* 1.9 2.5*  --   --   PHOS 5.5*  --  2.7 2.1*  --   --     CBC  Recent Labs Lab 01/23/16 0212 01/24/16 0220 01/25/16 0240  WBC 10.1 12.5* 14.1*  HGB 11.2* 11.2* 11.1*  HCT 36.7 37.1 36.6  PLT 196 164 154    Coag's  Recent Labs Lab 01/19/16 2253  APTT 31  INR 1.35    Sepsis Markers  Recent Labs Lab 01/19/16 2253 01/19/16 2259 01/20/16 0219  LATICACIDVEN  --  0.9 0.5  PROCALCITON 0.78  --   --     ABG  No results for input(s): PHART, PCO2ART, PO2ART in the last 168 hours.  Liver Enzymes  Recent Labs Lab 01/19/16 2253 01/20/16 0219  AST 11* 10*  ALT 13* 12*  ALKPHOS 52 48  BILITOT 0.9 0.9  ALBUMIN 3.2* 3.0*    Cardiac Enzymes  Recent Labs Lab 01/22/16 0537 01/22/16 1312 01/22/16 1616  TROPONINI 1.28* 0.71* 0.78*    Glucose No results for input(s): GLUCAP in the last 168 hours.  Imaging Ct Angio Chest Pe W Or Wo Contrast  01/24/2016  CLINICAL DATA:  Evaluate for pulmonary embolus. Pulmonary hypertension. EXAM: CT ANGIOGRAPHY CHEST WITH CONTRAST TECHNIQUE: Multidetector CT imaging of the chest was performed using the standard protocol during bolus administration of intravenous contrast. Multiplanar CT image reconstructions and MIPs were obtained to evaluate the vascular anatomy. CONTRAST:   130 cc of Isovue 350 COMPARISON:  None. FINDINGS: Mediastinum/Lymph Nodes: During the first contrast injection there was insufficient pulmonary arterial opacification for diagnostic assessment of acute pulmonary embolus. A second injection was performed which extravasated into the patient's arm. The main pulmonary artery measures 3.8 cm in diameter, image 49 of series 4. The heart size is mildly enlarged. Calcification within the mitral valve is identified. There is mild aortic atherosclerosis. No enlarged mediastinal or hilar lymph nodes identified. There is no pericardial effusion. Lungs/Pleura: There is a small to moderate right pleural effusion. Small left pleural effusion is also identified. A mild mosaic attenuation pattern is identified within the lungs. No airspace consolidation or atelectasis identified. No suspicious pulmonary nodule or mass identified Upper abdomen: No acute findings identified within the upper abdomen. Musculoskeletal: There is degenerative disc disease identified within the thoracic spine. No aggressive lytic or sclerotic bone lesions. Review of the MIP images confirms the above findings. IMPRESSION: 1. Examination is nondiagnostic for the presence or absence of pulmonary embolus. 2. There is increase caliber of the main pulmonary artery which may be a manifestation of pulmonary hypertension. 3. Aortic atherosclerosis and LAD coronary artery calcification 4. Small pleural effusions right greater in left 5. Mosaic attenuation pattern within the lungs compatible with either small airways disease or chronic pulmonary emboli. Electronically Signed   By: Signa Kell M.D.   On: 01/24/2016 11:26     STUDIES:  7/10 renal US negative 09-Feb-2023 Echo: EF 65 to 70%, grade 2 diastolic CHF, mild AR, mild MR, severe RV dilation, mod TR, PAS 114 mmHg 7/13 V/Q scan: normal 7/13 B/l LE doppler U/S: small segment of DVT in the R popliteal v. 7/14 Lt and Rt heart cath >> no CAD; elevated PA  pressures  CULTURES: Blood 7/10 >  ANTIBIOTICS: Cipro 7/10 >7/13 Flagyl 7/10 >7/13  SIGNIFICANT EVENTS: 7/10 transferred to [facility] with hypotension and acute renal failure. 7/11 inferior ekg changes 7/14 heart cath 7/15 IV contrast extravasation into Rt arm during CT angiogram  LINES/TUBES: Left radial a line 7/10 >7/12  DISCUSSION: 50 yo female with n/v/d and AKI.  Developed chest pain, ECG changes, bradycardia hypotension.  Found to have severe pulmonary HTN.  ASSESSMENT / PLAN:  CARDIOVASCULAR A:  Hypotension 2nd to hypovolemia >> improved. Chest pain >> no CAD on cath. Bradycardia. Hx of HLD.  Pulmonary HTN. Rt leg DVT. Difficult IV access. P:  Monitor hemodynamics Continue outpatient atorvastatin, aspirin Hold outpatient lisinopril, spironolactone, furosemide Continue Heparin drip  Try to repeat CT angio chest 7/16 IV team to place PICC  RENAL A:   AKI >> resolved. Metabolic acidosis >> resolved. P:   Monitor renal fx, urine  outpt D/c foley  GASTROINTESTINAL A:   Nausea >> improved. P:   Advance diet as tolerated  PULMONARY A: Pulmonary HTN. Hx of OSA. Rt diaphragm elevation. P:   F/u serology CPAP qhs Oxygen to keep SpO2 > 92%  HEMATOLOGIC A:   No acute issues. P:  F/u CBC  INFECTIOUS A:   Colitis >> resolved. P:   Monitor off Abx  ENDOCRINE A:   Hypothyroidism: TSH normal at 2.2 on 7/11. P:   Continue outpatient synthroid.  NEUROLOGIC A:   Lt foot pain. P:   Prn pain meds  Transfer to tele 7/16 >> keep on PCCM service.  Coralyn HellingVineet Shafin Pollio, MD [facility] Pulmonary/Critical Care 01/25/2016, 8:56 AM Pager:  [phone] After 3pm call: [phone]

## 2016-01-25 NOTE — Progress Notes (Signed)
Peripherally Inserted Central Catheter/Midline Placement  The IV Nurse has discussed with the patient and/or persons authorized to consent for the patient, the purpose of this procedure and the potential benefits and risks involved with this procedure.  The benefits include less needle sticks, lab draws from the catheter; may be exchanged for a different type if ordered and patient may be discharged home with the catheter.  Risks include, but not limited to, infection, bleeding, blood clot (thrombus formation), and puncture of an artery; nerve damage and irregular heat beat.  Alternatives to this procedure were also discussed.  Education material left at bedside.  PICC/Midline Placement Documentation  PICC Double Lumen 01/25/16 PICC Left Cephalic 43 cm 2 cm (Active)  Indication for Insertion or Continuance of Line Poor Vasculature-patient has had multiple peripheral attempts or PIVs lasting less than 24 hours 01/25/2016 10:20 AM  Exposed Catheter (cm) 2 cm 01/25/2016 10:20 AM  Site Assessment Clean;Dry;Intact 01/25/2016 10:20 AM  Lumen #1 Status Flushed;Saline locked;Blood return noted 01/25/2016 10:20 AM  Lumen #2 Status Flushed;Saline locked;Blood return noted 01/25/2016 10:20 AM  Dressing Type Transparent 01/25/2016 10:20 AM  Dressing Status Clean;Dry;Intact 01/25/2016 10:20 AM  Dressing Change Due 02/01/16 01/25/2016 10:20 AM       Ethelda Chickurrie, Chanc Kervin Robert 01/25/2016, 10:22 AM

## 2016-01-25 NOTE — Progress Notes (Signed)
DANTICOAGULATION CONSULT NOTE  Pharmacy Consult for heparin per pharmacy Indication:  DVT, r/o PE  Allergies  Allergen Reactions  . Penicillins Anaphylaxis and Hives    Patient Measurements: Height: 5\' 4"  (162.6 cm) Weight: 269 lb (122.018 kg) IBW/kg (Calculated) : 54.7 Heparin Dosing Weight: 85 kg  Vital Signs: Temp: 98.1 F (36.7 C) (07/16 1157) Temp Source: Oral (07/16 1157) BP: 108/69 mmHg (07/16 1157) Pulse Rate: 62 (07/16 1157)  Labs:  Recent Labs  01/22/16 1312 01/22/16 1616  01/23/16 0212 01/24/16 0220 01/24/16 0826 01/24/16 2026 01/25/16 0240 01/25/16 0824  HGB  --   --   < > 11.2* 11.2*  --   --  11.1*  --   HCT  --   --   --  36.7 37.1  --   --  36.6  --   PLT  --   --   --  196 164  --   --  154  --   HEPARINUNFRC 0.40  --   --  0.33  --  <0.10* 0.47  --  0.49  CREATININE  --   --   --  1.22* 1.14*  --   --  1.09*  --   TROPONINI 0.71* 0.78*  --   --   --   --   --   --   --   < > = values in this interval not displayed.  Estimated Creatinine Clearance: 80.4 mL/min (by C-G formula based on Cr of 1.09).  Assessment: 50 yo female admitted with n/v/d, began having chest pain on 7/11 and had another episode yesterday morning.  ECHO completed 7/12 showed elevated PA pressure and R heart stain. She continues on heparin gtt s/p cardiac cath for DVT and possible PE (CT nondiagnostic).  -Heparin level is  at goal on 1300 units/hr (HL= 0.47)  Goal of Therapy:  Heparin level 0.3-0.7 units/ml Monitor platelets by anticoagulation protocol: Yes  Plan:   -No heparin changes needed -Heparin level and CBC daily -Will follow oral anticoagulation plans  Harland Germanndrew Idelle Reimann, Pharm D 01/25/2016 12:21 PM

## 2016-01-26 ENCOUNTER — Encounter (HOSPITAL_COMMUNITY): Payer: Self-pay | Admitting: Cardiovascular Disease

## 2016-01-26 DIAGNOSIS — R0902 Hypoxemia: Secondary | ICD-10-CM

## 2016-01-26 DIAGNOSIS — Z9989 Dependence on other enabling machines and devices: Secondary | ICD-10-CM

## 2016-01-26 DIAGNOSIS — I82402 Acute embolism and thrombosis of unspecified deep veins of left lower extremity: Secondary | ICD-10-CM

## 2016-01-26 DIAGNOSIS — G4733 Obstructive sleep apnea (adult) (pediatric): Secondary | ICD-10-CM | POA: Insufficient documentation

## 2016-01-26 LAB — CBC
HCT: 35.1 % — ABNORMAL LOW (ref 36.0–46.0)
Hemoglobin: 10.4 g/dL — ABNORMAL LOW (ref 12.0–15.0)
MCH: 27.6 pg (ref 26.0–34.0)
MCHC: 29.6 g/dL — ABNORMAL LOW (ref 30.0–36.0)
MCV: 93.1 fL (ref 78.0–100.0)
PLATELETS: 142 10*3/uL — AB (ref 150–400)
RBC: 3.77 MIL/uL — AB (ref 3.87–5.11)
RDW: 15.7 % — AB (ref 11.5–15.5)
WBC: 13.2 10*3/uL — ABNORMAL HIGH (ref 4.0–10.5)

## 2016-01-26 LAB — BASIC METABOLIC PANEL
Anion gap: 4 — ABNORMAL LOW (ref 5–15)
BUN: 14 mg/dL (ref 6–20)
CHLORIDE: 100 mmol/L — AB (ref 101–111)
CO2: 33 mmol/L — AB (ref 22–32)
CREATININE: 0.85 mg/dL (ref 0.44–1.00)
Calcium: 8.4 mg/dL — ABNORMAL LOW (ref 8.9–10.3)
GFR calc Af Amer: >60 mL/min (ref >60)
GFR calc non Af Amer: >60 mL/min (ref >60)
Glucose, Bld: 79 mg/dL (ref 65–99)
Potassium: 4.5 mmol/L (ref 3.5–5.1)
Sodium: 137 mmol/L (ref 135–145)

## 2016-01-26 LAB — HEPARIN LEVEL (UNFRACTIONATED): HEPARIN UNFRACTIONATED: 0.45 [IU]/mL (ref 0.30–0.70)

## 2016-01-26 MED ORDER — IOPAMIDOL (ISOVUE-370) INJECTION 76%
100.0000 mL | Freq: Once | INTRAVENOUS | Status: AC | PRN
  Administered 2016-01-24: 100 mL via INTRAVENOUS

## 2016-01-26 MED ORDER — TORSEMIDE 20 MG PO TABS
50.0000 mg | ORAL_TABLET | Freq: Two times a day (BID) | ORAL | Status: DC
  Administered 2016-01-26 – 2016-01-30 (×8): 50 mg via ORAL
  Filled 2016-01-26 (×10): qty 3

## 2016-01-26 MED ORDER — WARFARIN SODIUM 7.5 MG PO TABS
7.5000 mg | ORAL_TABLET | Freq: Once | ORAL | Status: AC
  Administered 2016-01-26: 7.5 mg via ORAL
  Filled 2016-01-26: qty 1

## 2016-01-26 MED ORDER — SPIRONOLACTONE 25 MG PO TABS
25.0000 mg | ORAL_TABLET | Freq: Every day | ORAL | Status: DC
  Administered 2016-01-27 – 2016-01-29 (×3): 25 mg via ORAL
  Filled 2016-01-26 (×3): qty 1

## 2016-01-26 MED ORDER — WARFARIN - PHARMACIST DOSING INPATIENT: Freq: Every day | Status: DC

## 2016-01-26 MED ORDER — FUROSEMIDE 10 MG/ML IJ SOLN
20.0000 mg | Freq: Once | INTRAMUSCULAR | Status: AC
  Administered 2016-01-26: 20 mg via INTRAVENOUS
  Filled 2016-01-26: qty 2

## 2016-01-26 NOTE — Progress Notes (Signed)
Placed patient on CPAP for the night without complications. RT will continue to monitor as needed. 

## 2016-01-26 NOTE — Progress Notes (Signed)
PULMONARY / CRITICAL CARE MEDICINE   Name: Bonnie Krause MRN: [id number] DOB: Aug 18, 1965    ADMISSION DATE:  01/19/2016 CONSULTATION DATE:  01/19/16  REFERRING MD:  Blaine Hamper [facility])  CHIEF COMPLAINT:  N/V/D, hypotension  HISTORY OF PRESENT ILLNESS:   Bonnie Krause is a 50 y.o. female with PMH as outlined below. She was seen by PCP 7/10 for N/V/D, abd pain, generalized weakness x 3 - 4 weeks.  At PCP office, she was found to be hypotensive so was sent to [facility] for further evaluation.  At [facility], she had BP in 70's and was found to have acute renal failure with SCr 9.50.  She was transferred to [facility] for further evaluation and management and was admitted by [facility] team.  PCCM was called later that evening for persistent hypotension with SBP in 70's - 80's.  Of note, she has remained awake with normal mental status since admission and state she occasionally has low BP but is unable to tell me what her pressures typically run.  She states N/V/D have been going on for roughly 3 - 4 weeks and have been associated with abd pain.  Diarrhea has been as many as 8 times per night, vomiting 2 - 3 times per day.  She has not tolerated any food as she has never been hungry.  Has been trying to drink as much water as possible, occasionally able to keep it down.  Has had intermittent subjective fevers and chills.  Denies any hematochezia or melena. Has had dull chest tightness / pain with radiation into left arm with numbness / tingling that started at the same time her N/V/D did 3 - 4 weeks ago.  This has not changed since in character or frequency.  Denies lightheadedness, diaphoresis, syncope.  SUBJECTIVE:  Breathing much better.  Has pain in left leg along with edema. States she has gained 30lbs since admission.  VITAL SIGNS: BP 106/56 mmHg  Pulse 58  Temp(Src) 98.3 F (36.8 C) (Oral)  Resp 18  Ht _0  (1.626 m)  Wt 269 lb (122.018 kg)  BMI 46.15 kg/m2  SpO2 100%  LMP  12/30/2015  INTAKE / OUTPUT: I/O last 3 completed shifts: In: 435 [P.O.:240; I.V.:195] Out: 1050 [Urine:1050]   PHYSICAL EXAMINATION: General: middle aged female, in NAD Neuro: normal strength HEENT: [place]/AT, no stridor Cardiac: regular, no murmur Chest: clear, no wheeze Abd: soft, non tender Ext: 1+ edema bilaterally LLE > RLE Skin: no rashes  LABS:  BMET  Recent Labs Lab 01/24/16 0220 01/25/16 0240 01/26/16 0455  NA 145 141 137  K 4.0 4.2 4.5  CL 107 104 100*  CO2 29 30 33*  BUN 26* 24* 14  CREATININE 1.14* 1.09* 0.85  GLUCOSE 99 77 79    Electrolytes  Recent Labs Lab 01/21/16 0454 01/22/16 0246 01/22/16 0537 01/23/16 0212 01/24/16 0220 01/25/16 0240 01/26/16 0455  CALCIUM 7.8* 8.0* 8.0* 8.3* 8.4* 8.6* 8.4*  MG 1.4* 1.4* 1.9 2.5*  --   --   --   PHOS 5.5*  --  2.7 2.1*  --   --   --     CBC  Recent Labs Lab 01/24/16 0220 01/25/16 0240 01/26/16 0455  WBC 12.5* 14.1* 13.2*  HGB 11.2* 11.1* 10.4*  HCT 37.1 36.6 35.1*  PLT 164 154 142*    Coag's  Recent Labs Lab 01/19/16 2253  APTT 31  INR 1.35    Sepsis Markers  Recent Labs Lab 01/19/16 2253 01/19/16 2259 01/20/16  2824  LATICACIDVEN  --  0.9 0.5  PROCALCITON 0.78  --   --     ABG No results for input(s): PHART, PCO2ART, PO2ART in the last 168 hours.  Liver Enzymes  Recent Labs Lab 01/19/16 2253 01/20/16 0219  AST 11* 10*  ALT 13* 12*  ALKPHOS 52 48  BILITOT 0.9 0.9  ALBUMIN 3.2* 3.0*    Cardiac Enzymes  Recent Labs Lab 01/22/16 0537 01/22/16 1312 01/22/16 1616  TROPONINI 1.28* 0.71* 0.78*    Glucose No results for input(s): GLUCAP in the last 168 hours.  Imaging No results found.   STUDIES:  7/10 renal US negative 01/28/2023 Echo: EF 65 to 70%, grade 2 diastolic CHF, mild AR, mild MR, severe RV dilation, mod TR, PAS 114 mmHg 7/13 V/Q scan: normal 7/13 B/l LE doppler U/S: small segment of DVT in the R popliteal v. 7/14 Lt and Rt heart cath >> no CAD;  elevated PA pressures 7/15 CTA chest > insufficient pulmonary arterial opacification for diagnostic assessment of acute PE.  CULTURES: Blood 7/10 >  ANTIBIOTICS: Cipro 7/10 >7/13 Flagyl 7/10 >7/13  SIGNIFICANT EVENTS: 7/10 transferred to [facility] with hypotension and acute renal failure. 7/11 inferior ekg changes 7/14 heart cath 7/15 IV contrast extravasation into Rt arm during CT angiogram 7/16 out of ICU to floor  LINES/TUBES: Left radial a line 7/10 >7/12  DISCUSSION: 50 yo female with n/v/d and AKI.  Developed chest pain, ECG changes, bradycardia hypotension.  Found to have severe pulmonary HTN.  ASSESSMENT / PLAN:  CARDIOVASCULAR A:  Hypotension 2nd to hypovolemia > improved. Chest pain > no CAD on cath. Bradycardia. Hx of HLD. Pulmonary HTN. Rt leg DVT. Difficult IV access. P:  Monitor hemodynamics Continue outpatient atorvastatin, aspirin Hold outpatient lisinopril Start back outpatient torsemide, spironolactone Continue Heparin drip Start transition to Warfarin 7/17 Consider repeat CT chest to definitively rule out PE  RENAL A:   AKI >> resolved. Metabolic acidosis >> resolved. P:   Start back outpatient torsemide, spironolactone Monitor renal fx, urine outpt  GASTROINTESTINAL A:   Nausea >> improved. P:   Advance diet as tolerated  PULMONARY A: Pulmonary HTN. Hx of OSA - serologies negative (ESR, CRP, ANA, ANCA, Anti Scl-70, Anti Sm, RF, CK). Rt diaphragm elevation. P:   CPAP qhs Oxygen to keep SpO2 > 92%  HEMATOLOGIC A:   No acute issues. P:  F/u CBC  INFECTIOUS A:   Colitis >> resolved. P:   Monitor off Abx  ENDOCRINE A:   Hypothyroidism: TSH normal at 2.2 on 7/11. P:   Continue outpatient synthroid  NEUROLOGIC A:   Lt foot pain. P:   Prn pain meds   Rutherford Guys, PA - C  Pulmonary & Critical Care Medicine Pager: [phone]  or [phone] 01/26/2016, 1:36 PM  Attending Note:  50 year old female  with PMH of severe pul HTN who was transferred to ICU for hypotension and hypovolemic shock.  Patient responded to treatment and was transferred out of the ICU.  On exam, lungs were clear to auscultation.  I reviewed CXR myself, cardiomegally noted.  Discussed with TRH-MD and PCCM-NP.    Pul HTN:  - Diuretics.  - Will need outpatient f/u.  OSA:  - CPAP.  Hypoxemia:  - Titrate O2 for sat of 88-92%.  - Ambulatory desat study, if sat <=90% then home O2 (pul HTN).  R Leg DVT  - Heparin.  - Transition to coumadin.  Transfer care  to [facility] and PCCM off starting 7/18.  Patient seen and examined, agree with above note.  I dictated the care and orders written for this patient under my direction.  Rush Farmer, MD [phone]

## 2016-01-26 NOTE — Progress Notes (Signed)
Patient ID: Bonnie Krause, female   DOB: April 03, 1966, 50 y.o.   MRN: [id number] Subjective:  Some dyspnea and nausea   Objective:  Vital Signs in the last 24 hours: BP 97/66 mmHg  Pulse 110  Temp(Src) 97.9 F (36.6 C) (Oral)  Resp 18  Ht 5\' 4"  (1.626 m)  Wt 269 lb (122.018 kg)  BMI 46.15 kg/m2  SpO2 92%  LMP 12/30/2015  Physical Exam: Obese white female  Skin somewhat mottled Lungs:  Clear  Cardiac:  Regular rhythm, normal S1 and S2, no S3 Extremities:  Radial cath site clean and dry   Intake/Output from previous day: 07/16 0701 - 07/17 0700 In: 279 [P.O.:240; I.V.:39] Out: 700 [Urine:700] Weight Filed Weights   01/20/16 0100  Weight: 269 lb (122.018 kg)    Lab Results: Basic Metabolic Panel:  Recent Labs  40/98/1107/16/17 0240 01/26/16 0455  NA 141 137  K 4.2 4.5  CL 104 100*  CO2 30 33*  GLUCOSE 77 79  BUN 24* 14  CREATININE 1.09* 0.85    CBC:  Recent Labs  01/25/16 0240 01/26/16 0455  WBC 14.1* 13.2*  HGB 11.1* 10.4*  HCT 36.6 35.1*  MCV 93.1 93.1  PLT 154 142*    BNP    Component Value Date/Time   BNP 467.4* 01/19/2016 2253    PROTIME: Lab Results  Component Value Date   INR 1.35 01/19/2016    Telemetry:Sinus rhythm 01/26/2016   Current facility-administered medications:  .  acetaminophen (TYLENOL) tablet 650 mg, 650 mg, Oral, Q4H PRN, Lorretta HarpXilin Niu, MD, 650 mg at 01/21/16 1807 .  albuterol (PROVENTIL) (2.5 MG/3ML) 0.083% nebulizer solution 2.5 mg, 2.5 mg, Nebulization, Q4H PRN, Lorretta HarpXilin Niu, MD .  aspirin EC tablet 81 mg, 81 mg, Oral, Daily, Coralyn HellingVineet Sood, MD, 81 mg at 01/25/16 0936 .  atorvastatin (LIPITOR) tablet 40 mg, 40 mg, Oral, q1800, Lorretta HarpXilin Niu, MD, 40 mg at 01/25/16 1727 .  fentaNYL (SUBLIMAZE) injection 25-50 mcg, 25-50 mcg, Intravenous, Q2H PRN, Rahul P Desai, PA-C, 50 mcg at 01/25/16 0732 .  fluconazole (DIFLUCAN) tablet 100 mg, 100 mg, Oral, Daily, Coralyn HellingVineet Sood, MD, 100 mg at 01/25/16 0936 .  heparin ADULT infusion 100 units/mL  (25000 units/23750mL sodium chloride 0.45%), 1,300 Units/hr, Intravenous, Continuous, Faye RamsayRachel L Rumbarger, RPH, Last Rate: 13 mL/hr at 01/25/16 0000, 1,300 Units at 01/25/16 0000 .  levothyroxine (SYNTHROID, LEVOTHROID) tablet 175 mcg, 175 mcg, Oral, QAC breakfast, Lorretta HarpXilin Niu, MD, 175 mcg at 01/26/16 0834 .  mometasone-formoterol (DULERA) 200-5 MCG/ACT inhaler 2 puff, 2 puff, Inhalation, BID, Lorretta HarpXilin Niu, MD, 2 puff at 01/25/16 2156 .  nitroGLYCERIN (NITROSTAT) SL tablet 0.4 mg, 0.4 mg, Sublingual, Q5 min PRN, Roslynn AmbleJennings E Nestor, MD, 0.4 mg at 01/22/16 0206 .  ondansetron (ZOFRAN) injection 4 mg, 4 mg, Intravenous, Q6H PRN, Lorretta HarpXilin Niu, MD, 4 mg at 01/21/16 2219 .  oxyCODONE-acetaminophen (PERCOCET/ROXICET) 5-325 MG per tablet 1-2 tablet, 1-2 tablet, Oral, Q4H PRN, Coralyn HellingVineet Sood, MD, 2 tablet at 01/26/16 0834 .  pantoprazole (PROTONIX) EC tablet 40 mg, 40 mg, Oral, Q1200, Vilinda BlanksWilliam S Minor, NP, 40 mg at 01/25/16 1216 .  promethazine (PHENERGAN) injection 12.5 mg, 12.5 mg, Intravenous, Q4H PRN, Roslynn AmbleJennings E Nestor, MD, 12.5 mg at 01/22/16 0129 .  sodium chloride flush (NS) 0.9 % injection 10-40 mL, 10-40 mL, Intracatheter, Q12H, Nelda Bucksaniel J Feinstein, MD .  sodium chloride flush (NS) 0.9 % injection 10-40 mL, 10-40 mL, Intracatheter, PRN, Nelda Bucksaniel J Feinstein, MD  Assessment/Plan:  1.  Severe pulmonary hypertension with  right ventricular dysfunction-at this point it looks like primary pulmonary hypertension  PA 84 mmHg right heart cath with  LVEDP only 8 mmHg V/Q negative for PE  BP low ? Start revatio  2.  No significant CAD 3.  Obesity 4.  History of sleep apnea  Plan per pulmonary will sign off   Charlton Haws

## 2016-01-26 NOTE — Progress Notes (Addendum)
DANTICOAGULATION CONSULT NOTE  Pharmacy Consult for heparin per pharmacy Indication:  DVT  Allergies  Allergen Reactions  . Penicillins Anaphylaxis and Hives    Patient Measurements: Height: 5\' 4"  (162.6 cm) Weight: 269 lb (122.018 kg) IBW/kg (Calculated) : 54.7 Heparin Dosing Weight: 85 kg  Vital Signs: Temp: 98.3 F (36.8 C) (07/17 1024) Temp Source: Oral (07/17 1024) BP: 106/56 mmHg (07/17 1024) Pulse Rate: 58 (07/17 1024)  Labs:  Recent Labs  01/24/16 0220  01/24/16 2026 01/25/16 0240 01/25/16 0824 01/26/16 0455  HGB 11.2*  --   --  11.1*  --  10.4*  HCT 37.1  --   --  36.6  --  35.1*  PLT 164  --   --  154  --  142*  HEPARINUNFRC  --   < > 0.47  --  0.49 0.45  CREATININE 1.14*  --   --  1.09*  --  0.85  < > = values in this interval not displayed.  Estimated Creatinine Clearance: 103.1 mL/min (by C-G formula based on Cr of 0.85).  Assessment: 50 yo female admitted with n/v/d, began having chest pain on 7/11. ECHO completed 7/12 showed elevated PA pressure and R heart stain. She was started on IV heparin. Positive for DVT, workup for PE negative.  Heparin level remains therapeutic this morning. Hgb and plts with slight drops, no bleeding noted.  Goal of Therapy:  Heparin level 0.3-0.7 units/ml Monitor platelets by anticoagulation protocol: Yes  Plan:   -Continue heparin at 1300 units/hr -Heparin level and CBC daily -Follow long term AC plans  [place] D. Ryllie Nieland, PharmD, BCPS Clinical Pharmacist Pager: [phone] 01/26/2016 10:53 AM   ADDENDUM Now to start warfarin for long term anticoagulation.  Today is D#1/5 of overlap.  Plan: -warfarin 7.5mg  po x1 tonight -daily INR -rest of plan as above  Konstantinos Cordoba D. Vincentina Sollers, PharmD, BCPS Clinical Pharmacist Pager: [phone] 01/26/2016 2:04 PM

## 2016-01-27 DIAGNOSIS — E039 Hypothyroidism, unspecified: Secondary | ICD-10-CM

## 2016-01-27 DIAGNOSIS — E875 Hyperkalemia: Secondary | ICD-10-CM

## 2016-01-27 DIAGNOSIS — E785 Hyperlipidemia, unspecified: Secondary | ICD-10-CM

## 2016-01-27 DIAGNOSIS — I10 Essential (primary) hypertension: Secondary | ICD-10-CM

## 2016-01-27 DIAGNOSIS — I950 Idiopathic hypotension: Secondary | ICD-10-CM

## 2016-01-27 DIAGNOSIS — R1010 Upper abdominal pain, unspecified: Secondary | ICD-10-CM

## 2016-01-27 DIAGNOSIS — I209 Angina pectoris, unspecified: Secondary | ICD-10-CM

## 2016-01-27 DIAGNOSIS — R9431 Abnormal electrocardiogram [ECG] [EKG]: Secondary | ICD-10-CM

## 2016-01-27 LAB — PROTIME-INR
INR: 1.58 — AB (ref 0.00–1.49)
PROTHROMBIN TIME: 18.9 s — AB (ref 11.6–15.2)

## 2016-01-27 LAB — BASIC METABOLIC PANEL
ANION GAP: 7 (ref 5–15)
BUN: 12 mg/dL (ref 6–20)
CALCIUM: 8.8 mg/dL — AB (ref 8.9–10.3)
CO2: 35 mmol/L — ABNORMAL HIGH (ref 22–32)
Chloride: 95 mmol/L — ABNORMAL LOW (ref 101–111)
Creatinine, Ser: 1.03 mg/dL — ABNORMAL HIGH (ref 0.44–1.00)
GLUCOSE: 89 mg/dL (ref 65–99)
POTASSIUM: 4 mmol/L (ref 3.5–5.1)
SODIUM: 137 mmol/L (ref 135–145)

## 2016-01-27 LAB — CBC
HCT: 35.6 % — ABNORMAL LOW (ref 36.0–46.0)
Hemoglobin: 10.7 g/dL — ABNORMAL LOW (ref 12.0–15.0)
MCH: 27.6 pg (ref 26.0–34.0)
MCHC: 30.1 g/dL (ref 30.0–36.0)
MCV: 92 fL (ref 78.0–100.0)
PLATELETS: 139 10*3/uL — AB (ref 150–400)
RBC: 3.87 MIL/uL (ref 3.87–5.11)
RDW: 15.6 % — AB (ref 11.5–15.5)
WBC: 11.2 10*3/uL — AB (ref 4.0–10.5)

## 2016-01-27 LAB — HEPARIN LEVEL (UNFRACTIONATED): HEPARIN UNFRACTIONATED: 0.34 [IU]/mL (ref 0.30–0.70)

## 2016-01-27 NOTE — Progress Notes (Signed)
DANTICOAGULATION CONSULT NOTE  Pharmacy Consult for heparin + warfarin Indication:  DVT  Allergies  Allergen Reactions  . Penicillins Anaphylaxis and Hives    Patient Measurements: Height: 5\' 4"  (162.6 cm) Weight: 279 lb 14.4 oz (126.962 kg) IBW/kg (Calculated) : 54.7 Heparin Dosing Weight: 85 kg  Vital Signs: Temp: 98.1 F (36.7 C) (07/18 0518) Temp Source: Oral (07/18 0518) BP: 106/54 mmHg (07/18 0518) Pulse Rate: 59 (07/18 0518)  Labs:  Recent Labs  01/25/16 0240 01/25/16 0824 01/26/16 0455 01/27/16 0445  HGB 11.1*  --  10.4* 10.7*  HCT 36.6  --  35.1* 35.6*  PLT 154  --  142* 139*  LABPROT  --   --   --  18.9*  INR  --   --   --  1.58*  HEPARINUNFRC  --  0.49 0.45 0.34  CREATININE 1.09*  --  0.85 1.03*    Estimated Creatinine Clearance: 87.2 mL/min (by C-G formula based on Cr of 1.03).  Assessment: 50 yo female admitted with n/v/d, began having chest pain on 7/11. ECHO completed 7/12 showed elevated PA pressure and R heart stain. Positive for DVT, workup for PE negative.  Today is D#2/5 of overlap with heparin + warfarin.  Heparin level remains therapeutic this morning at 0.34. INR 1.58.  Hgb and plts overall stable, no bleeding noted.  Goal of Therapy:  Heparin level 0.3-0.7 units/ml Monitor platelets by anticoagulation protocol: Yes  Plan:   -Continue heparin at 1300 units/hr -Warfarin 7.5mg  po x1 tonight  -Heparin level, INR and CBC daily -Follow for s/s bleeding -Warfarin education to be completed  Darbie Biancardi D. Lalena Salas, PharmD, BCPS Clinical Pharmacist Pager: [phone] 01/27/2016 8:23 AM

## 2016-01-27 NOTE — Discharge Instructions (Signed)

## 2016-01-27 NOTE — Progress Notes (Signed)
Patient placed on CPAP for the night without complications. Tolerating well and RT will continue to monitor.

## 2016-01-27 NOTE — Progress Notes (Addendum)
PROGRESS NOTE    Bonnie Krause  UXL:[id number] DOB: 14-Nov-1965 DOA: 01/19/2016 PCP: Ronita Hipps, MD   Brief Narrative:  Bonnie Krause is a 50 y.o. female with PMH as outlined below. She was seen by PCP 7/10 for N/V/D, abd pain, generalized weakness x 3 - 4 weeks. At PCP office, she was found to be hypotensive so was sent to [facility] for further evaluation.  At [facility], she had BP in 70's and was found to have acute renal failure with SCr 9.50. She was transferred to [facility] for further evaluation and management and was admitted by [facility] team.  PCCM was called later that evening for persistent hypotension with SBP in 70's - 80's. Of note, she has remained awake with normal mental status since admission and state she occasionally has low BP but is unable to tell me what her pressures typically run.  She states N/V/D have been going on for roughly 3 - 4 weeks and have been associated with abd pain. Diarrhea has been as many as 8 times per night, vomiting 2 - 3 times per day. She has not tolerated any food as she has never been hungry. Has been trying to drink as much water as possible, occasionally able to keep it down. Has had intermittent subjective fevers and chills. Denies any hematochezia or melena. Has had dull chest tightness / pain with radiation into left arm with numbness / tingling that started at the same time her N/V/D did 3 - 4 weeks ago. This has not changed since in character or frequency. Denies lightheadedness, diaphoresis, syncope.  Assessment & Plan   Hypotension secondary to hypovolemia -Improved  Chest pain -Cardiology to consulted and appreciated -Status post cardiac catheterization, no CAD -Lisinopril held -Continue aspirin, statin  Pulmonary hypertension -Continue torsemide, spironolactone -Monitor intake and output, daily weights -serologies unremarkable (ESR, CRP, ANA, ANCA, Anti-Scl-70, Anti-sm, RF, CK) -Patient will need outpatient  follow-up with pulmonology  Right lower extremity DVT -Continue Heparin/Coumadin, INR 1.58 -Per pulmonology notes, consider repeat CT chest to definitively rule out PE (patient had IV contrast extravasation into the right arm during CTA on 7/15)   Acute kidney injury/metabolic acidosis -Resolved -Monitor intake and output  Nausea -Resolved, continue antiemetics as needed  Obstructive sleep apnea -Continue CPAP QHS  Colitis -Resolved, currently off of antibiotics  Hypothyroidism -Continue Synthroid, TSH 2.2  Bradycardia  Hyperlipidemia -Continue statin  Left foot pain -Continue pain control  DVT Prophylaxis  Heparin/coumadin  Code Status: DNR  Family Communication: None at bedside  Disposition Plan: Admitted.   Consultants PCCM (signed off) Cardiology (signed off)  Procedures  7/10 renal US negative 7/12 Echo: EF 65 to 53%, grade 2 diastolic CHF, mild AR, mild MR, severe RV dilation, mod TR, PAS 114 mmHg 7/13 V/Q scan: normal 7/13 B/l LE doppler U/S: small segment of DVT in the R popliteal v. 7/14 Lt and Rt heart cath >> no CAD; elevated PA pressures 7/15 CTA chest > insufficient pulmonary arterial opacification for diagnostic assessment of acute PE.  Antibiotics   Anti-infectives    Start     Dose/Rate Route Frequency Ordered Stop   01/22/16 1000  ciprofloxacin (CIPRO) IVPB 400 mg  Status:  Discontinued    Comments:  Cipro 400 mg IV q24h for CrCl < 30 mL/min   400 mg 200 mL/hr over 60 Minutes Intravenous Every 12 hours 01/22/16 0820 01/22/16 1107   01/20/16 2200  ciprofloxacin (CIPRO) IVPB 400 mg  Status:  Discontinued  Comments:  Cipro 400 mg IV q24h for CrCl < 30 mL/min   400 mg 200 mL/hr over 60 Minutes Intravenous Daily at bedtime 01/19/16 2310 01/22/16 0820   01/20/16 1000  fluconazole (DIFLUCAN) tablet 100 mg     100 mg Oral Daily 01/19/16 2254 01/26/16 1036   01/19/16 2245  ciprofloxacin (CIPRO) IVPB 400 mg  Status:  Discontinued     400  mg 200 mL/hr over 60 Minutes Intravenous Every 12 hours 01/19/16 2231 01/19/16 2310   01/19/16 2230  metroNIDAZOLE (FLAGYL) IVPB 500 mg  Status:  Discontinued     500 mg 100 mL/hr over 60 Minutes Intravenous Every 8 hours 01/19/16 2229 01/22/16 1107      Subjective:   Bonnie Krause seen and examined today.  Feels better today. Feels swelling is getting better.  Denies chest pain, feels breathing has improved. Denies abdominal pain, nausea, vomiting, constipation, dizziness, headache.  Objective:   Filed Vitals:   01/26/16 2036 01/27/16 0518 01/27/16 0804 01/27/16 1016  BP: 98/65 106/54  105/57  Pulse: 91 59  63  Temp: 97.7 F (36.5 C) 98.1 F (36.7 C)  98.2 F (36.8 C)  TempSrc: Oral Oral  Oral  Resp: _0 Height:      Weight:  126.962 kg (279 lb 14.4 oz)    SpO2: 99% 97% 97% 100%    Intake/Output Summary (Last 24 hours) at 01/27/16 1319 Last data filed at 01/27/16 1235  Gross per 24 hour  Intake    727 ml  Output   4400 ml  Net  -3673 ml   Filed Weights   01/20/16 0100 01/27/16 0518  Weight: 122.018 kg (269 lb) 126.962 kg (279 lb 14.4 oz)    Exam  General: Well developed, well nourished, NAD, appears stated age  HEENT: NCAT, mucous membranes moist.   Neck: Supple, no JVD, no masses  Cardiovascular: S1 S2 auscultated, RRR, no murmur  Respiratory: Clear to auscultation bilaterally with equal chest rise, no wheezing  Abdomen: Soft, nontender, nondistended, + bowel sounds  Extremities: warm dry without cyanosis clubbing. +1 edema LLE > RLE  Neuro: AAOx3, nonfocal  Skin: Without rashes exudates or nodules  Psych: Normal affect and demeanor with intact judgement and insight   Data Reviewed: I have personally reviewed following labs and imaging studies  CBC:  Recent Labs Lab 01/21/16 0454 01/22/16 0537 01/23/16 0212 01/24/16 0220 01/25/16 0240 01/26/16 0455 01/27/16 0445  WBC 3.8* 8.6 10.1 12.5* 14.1* 13.2* 11.2*  NEUTROABS 2.6 3.5  --   --    --   --   --   HGB 10.9* 11.2* 11.2* 11.2* 11.1* 10.4* 10.7*  HCT 31.4* 34.5* 36.7 37.1 36.6 35.1* 35.6*  MCV 83.5 87.1 90.0 92.3 93.1 93.1 92.0  PLT 143* 166 196 164 154 142* 161*   Basic Metabolic Panel:  Recent Labs Lab 01/21/16 0454 01/22/16 0246 01/22/16 0537 01/23/16 0212 01/24/16 0220 01/25/16 0240 01/26/16 0455 01/27/16 0445  NA 145 146* 147* 144 145 141 137 137  K 3.5 3.3* 3.5 4.0 4.0 4.2 4.5 4.0  CL 112* 107 106 106 107 104 100* 95*  CO2 21* 30 30 32 29 30 33* 35*  GLUCOSE 89 104* 92 111* 99 77 79 89  BUN 111* 71* 67* 36* 26* 24* 14 12  CREATININE 2.98* 1.91* 1.72* 1.22* 1.14* 1.09* 0.85 1.03*  CALCIUM 7.8* 8.0* 8.0* 8.3* 8.4* 8.6* 8.4* 8.8*  MG 1.4* 1.4* 1.9 2.5*  --   --   --   --  PHOS 5.5*  --  2.7 2.1*  --   --   --   --    GFR: Estimated Creatinine Clearance: 87.2 mL/min (by C-G formula based on Cr of 1.03). Liver Function Tests: No results for input(s): AST, ALT, ALKPHOS, BILITOT, PROT, ALBUMIN in the last 168 hours. No results for input(s): LIPASE, AMYLASE in the last 168 hours. No results for input(s): AMMONIA in the last 168 hours. Coagulation Profile:  Recent Labs Lab 01/27/16 0445  INR 1.58*   Cardiac Enzymes:  Recent Labs Lab 01/21/16 1220 01/21/16 2042 01/22/16 0247 01/22/16 0537 01/22/16 1312 01/22/16 1616  CKTOTAL  --  48  --   --   --   --   TROPONINI 0.33*  --  0.44* 1.28* 0.71* 0.78*   BNP (last 3 results) No results for input(s): PROBNP in the last 8760 hours. HbA1C: No results for input(s): HGBA1C in the last 72 hours. CBG: No results for input(s): GLUCAP in the last 168 hours. Lipid Profile: No results for input(s): CHOL, HDL, LDLCALC, TRIG, CHOLHDL, LDLDIRECT in the last 72 hours. Thyroid Function Tests: No results for input(s): TSH, T4TOTAL, FREET4, T3FREE, THYROIDAB in the last 72 hours. Anemia Panel: No results for input(s): VITAMINB12, FOLATE, FERRITIN, TIBC, IRON, RETICCTPCT in the last 72 hours. Urine  analysis:    Component Value Date/Time   COLORURINE YELLOW 01/21/2016 0840   APPEARANCEUR CLOUDY* 01/21/2016 0840   LABSPEC 1.019 01/21/2016 0840   PHURINE 5.0 01/21/2016 0840   GLUCOSEU NEGATIVE 01/21/2016 0840   HGBUR NEGATIVE 01/21/2016 0840   BILIRUBINUR NEGATIVE 01/21/2016 0840   KETONESUR 15* 01/21/2016 0840   PROTEINUR NEGATIVE 01/21/2016 0840   NITRITE NEGATIVE 01/21/2016 0840   LEUKOCYTESUR NEGATIVE 01/21/2016 0840   Sepsis Labs: _0 (procalcitonin:4,lacticidven:4)  ) Recent Results (from the past 240 hour(s))  Culture, blood (x 2)     Status: None   Collection Time: 01/19/16 10:37 PM  Result Value Ref Range Status   Specimen Description BLOOD BLOOD LEFT HAND  Final   Special Requests IN PEDIATRIC BOTTLE 1CC  Final   Culture NO GROWTH 5 DAYS  Final   Report Status 01/24/2016 FINAL  Final  Culture, blood (x 2)     Status: None   Collection Time: 01/19/16 10:55 PM  Result Value Ref Range Status   Specimen Description BLOOD RIGHT ARM  Final   Special Requests IN PEDIATRIC BOTTLE 1CC  Final   Culture NO GROWTH 5 DAYS  Final   Report Status 01/24/2016 FINAL  Final  MRSA PCR Screening     Status: None   Collection Time: 01/20/16 12:29 AM  Result Value Ref Range Status   MRSA by PCR NEGATIVE NEGATIVE Final    Comment:        The GeneXpert MRSA Assay (FDA approved for NASAL specimens only), is one component of a comprehensive MRSA colonization surveillance program. It is not intended to diagnose MRSA infection nor to guide or monitor treatment for MRSA infections.       Radiology Studies: No results found.   Scheduled Meds: . aspirin EC  81 mg Oral Daily  . atorvastatin  40 mg Oral q1800  . levothyroxine  175 mcg Oral QAC breakfast  . mometasone-formoterol  2 puff Inhalation BID  . pantoprazole  40 mg Oral Q1200  . sodium chloride flush  10-40 mL Intracatheter Q12H  . spironolactone  25 mg Oral Daily  . torsemide  50 mg Oral BID  . Warfarin -  Pharmacist Dosing  Inpatient   Does not apply q1800   Continuous Infusions: . heparin 1,300 Units/hr (01/27/16 1208)     LOS: 8 days   Time Spent in minutes   30 minutes  Terrell Shimko D.O. on 01/27/2016 at 1:19 PM  Between 7am to 7pm - Pager - [phone]  After 7pm go to [email or web address] - password [email or web address]  And look for the night coverage person covering for me after hours  [facility] Office  [phone]

## 2016-01-28 LAB — CBC
HCT: 34.4 % — ABNORMAL LOW (ref 36.0–46.0)
Hemoglobin: 10.7 g/dL — ABNORMAL LOW (ref 12.0–15.0)
MCH: 28 pg (ref 26.0–34.0)
MCHC: 31.1 g/dL (ref 30.0–36.0)
MCV: 90.1 fL (ref 78.0–100.0)
PLATELETS: 135 10*3/uL — AB (ref 150–400)
RBC: 3.82 MIL/uL — ABNORMAL LOW (ref 3.87–5.11)
RDW: 15.6 % — AB (ref 11.5–15.5)
WBC: 8.4 10*3/uL (ref 4.0–10.5)

## 2016-01-28 LAB — HEPARIN LEVEL (UNFRACTIONATED): HEPARIN UNFRACTIONATED: 0.38 [IU]/mL (ref 0.30–0.70)

## 2016-01-28 LAB — URINALYSIS, ROUTINE W REFLEX MICROSCOPIC
Bilirubin Urine: NEGATIVE
GLUCOSE, UA: NEGATIVE mg/dL
HGB URINE DIPSTICK: NEGATIVE
Ketones, ur: NEGATIVE mg/dL
Leukocytes, UA: NEGATIVE
Nitrite: NEGATIVE
PROTEIN: NEGATIVE mg/dL
Specific Gravity, Urine: 1.008 (ref 1.005–1.030)
pH: 8 (ref 5.0–8.0)

## 2016-01-28 LAB — BASIC METABOLIC PANEL
Anion gap: 8 (ref 5–15)
BUN: 12 mg/dL (ref 6–20)
CALCIUM: 9 mg/dL (ref 8.9–10.3)
CO2: 35 mmol/L — ABNORMAL HIGH (ref 22–32)
CREATININE: 1.18 mg/dL — AB (ref 0.44–1.00)
Chloride: 94 mmol/L — ABNORMAL LOW (ref 101–111)
GFR calc Af Amer: >60 mL/min (ref >60)
GFR calc non Af Amer: 53 mL/min — ABNORMAL LOW (ref >60)
GLUCOSE: 87 mg/dL (ref 65–99)
Potassium: 3.8 mmol/L (ref 3.5–5.1)
SODIUM: 137 mmol/L (ref 135–145)

## 2016-01-28 LAB — PROTIME-INR
INR: 1.99 — AB (ref 0.00–1.49)
Prothrombin Time: 22.5 seconds — ABNORMAL HIGH (ref 11.6–15.2)

## 2016-01-28 MED ORDER — RIVAROXABAN 15 MG PO TABS
15.0000 mg | ORAL_TABLET | Freq: Two times a day (BID) | ORAL | Status: DC
  Administered 2016-01-28 – 2016-01-29 (×2): 15 mg via ORAL
  Filled 2016-01-28 (×2): qty 1

## 2016-01-28 MED ORDER — WARFARIN SODIUM 2.5 MG PO TABS: 2.5000 mg | ORAL_TABLET | Freq: Once | ORAL | Status: DC

## 2016-01-28 NOTE — Progress Notes (Signed)
PROGRESS NOTE    Bonnie Krause  QIO:[id number] DOB: 1965/09/21 DOA: 01/19/2016 PCP: Ronita Hipps, MD   Brief Narrative:  Bonnie Krause is a 50 y.o. female with PMH as outlined below. She was seen by PCP 7/10 for N/V/D, abd pain, generalized weakness x 3 - 4 weeks. At PCP office, she was found to be hypotensive so was sent to [facility] for further evaluation.  At [facility], she had BP in 70's and was found to have acute renal failure with SCr 9.50. She was transferred to [facility] for further evaluation and management and was admitted by [facility] team.  PCCM was called later that evening for persistent hypotension with SBP in 70's - 80's. Of note, she has remained awake with normal mental status since admission and state she occasionally has low BP but is unable to tell me what her pressures typically run.  She states N/V/D have been going on for roughly 3 - 4 weeks and have been associated with abd pain. Diarrhea has been as many as 8 times per night, vomiting 2 - 3 times per day. She has not tolerated any food as she has never been hungry. Has been trying to drink as much water as possible, occasionally able to keep it down. Has had intermittent subjective fevers and chills. Denies any hematochezia or melena. Has had dull chest tightness / pain with radiation into left arm with numbness / tingling that started at the same time her N/V/D did 3 - 4 weeks ago. This has not changed since in character or frequency. Denies lightheadedness, diaphoresis, syncope.  Assessment & Plan   Hypotension secondary to hypovolemia -Blood pressures had improved with IV fluid resuscitation -Clinic continue monitoring.  Chest pain -Cardiology to consulted and appreciated -Status post cardiac catheterization, no CAD -Lisinopril held -Continue aspirin, statin  Pulmonary hypertension -Continue torsemide, spironolactone -Monitor intake and output, daily weights -serologies unremarkable (ESR,  CRP, ANA, ANCA, Anti-Scl-70, Anti-sm, RF, CK) -Patient will need outpatient follow-up with pulmonology  Right lower extremity DVT -She had been on IV heparin, plan to transition to oral anticoagulation with Xarelto. Pharmacy consulted  Acute kidney injury/metabolic acidosis -She has had intermittent improvement to her renal function, with a.m. lab work on 01/28/2016 showing creatinine 1.18 with BUN of 12. -Suspect that acute renal failure likely precipitated by dehydration/hypovolemia possibly from ATN in setting of hypotension. Patient responded to IV fluids -Monitor intake and output  Nausea -Resolved, continue antiemetics as needed  Obstructive sleep apnea -Continue CPAP QHS  Colitis -Resolved, currently off of antibiotics  Hypothyroidism -Continue Synthroid, TSH 2.2  Bradycardia  Hyperlipidemia -Continue statin  Left foot pain -Continue pain control  DVT Prophylaxis: She is fully anticoagulated  Code Status: Limited code  Family Communication: None at bedside  Disposition Plan: Anticipate discharge in the next 24-48 hours  Consultants PCCM (signed off) Cardiology (signed off)  Procedures  7/10 renal US negative 7/12 Echo: EF 65 to 32%, grade 2 diastolic CHF, mild AR, mild MR, severe RV dilation, mod TR, PAS 114 mmHg 7/13 V/Q scan: normal 7/13 B/l LE doppler U/S: small segment of DVT in the R popliteal v. 7/14 Lt and Rt heart cath >> no CAD; elevated PA pressures 7/15 CTA chest > insufficient pulmonary arterial opacification for diagnostic assessment of acute PE.  Antibiotics   Anti-infectives    Start     Dose/Rate Route Frequency Ordered Stop   01/22/16 1000  ciprofloxacin (CIPRO) IVPB 400 mg  Status:  Discontinued  Comments:  Cipro 400 mg IV q24h for CrCl < 30 mL/min   400 mg 200 mL/hr over 60 Minutes Intravenous Every 12 hours 01/22/16 0820 01/22/16 1107   01/20/16 2200  ciprofloxacin (CIPRO) IVPB 400 mg  Status:  Discontinued    Comments:  Cipro  400 mg IV q24h for CrCl < 30 mL/min   400 mg 200 mL/hr over 60 Minutes Intravenous Daily at bedtime 01/19/16 2310 01/22/16 0820   01/20/16 1000  fluconazole (DIFLUCAN) tablet 100 mg     100 mg Oral Daily 01/19/16 2254 01/26/16 1036   01/19/16 2245  ciprofloxacin (CIPRO) IVPB 400 mg  Status:  Discontinued     400 mg 200 mL/hr over 60 Minutes Intravenous Every 12 hours 01/19/16 2231 01/19/16 2310   01/19/16 2230  metroNIDAZOLE (FLAGYL) IVPB 500 mg  Status:  Discontinued     500 mg 100 mL/hr over 60 Minutes Intravenous Every 8 hours 01/19/16 2229 01/22/16 1107      Subjective:   [name] thinks she is doing a little better today, denies shortness of breath or chest pain. Tolerating by mouth intake.  Objective:   Filed Vitals:   01/28/16 0519 01/28/16 0837 01/28/16 1033 01/28/16 1300  BP: 102/53  103/67 84/53  Pulse: 64  79 67  Temp: 98.1 F (36.7 C)   97.6 F (36.4 C)  TempSrc: Oral   Oral  Resp: 18   17  Height:      Weight: 126.69 kg (279 lb 4.8 oz)     SpO2: 98% 100% 95% 98%    Intake/Output Summary (Last 24 hours) at 01/28/16 1500 Last data filed at 01/28/16 2423  Gross per 24 hour  Intake    240 ml  Output   2250 ml  Net  -2010 ml   Filed Weights   01/20/16 0100 01/27/16 0518 01/28/16 0519  Weight: 122.018 kg (269 lb) 126.962 kg (279 lb 14.4 oz) 126.69 kg (279 lb 4.8 oz)    Exam  General: Well developed, well nourished, NAD, appears stated age  3: NCAT, mucous membranes moist.   Neck: Supple, no JVD, no masses  Cardiovascular: S1 S2 auscultated, RRR, no murmur  Respiratory: Clear to auscultation bilaterally with equal chest rise, no wheezing  Abdomen: Soft, nontender, nondistended, + bowel sounds  Extremities: warm dry without cyanosis clubbing. +1 edema LLE > RLE  Neuro: AAOx3, nonfocal  Skin: Without rashes exudates or nodules  Psych: Normal affect and demeanor with intact judgement and insight   Data Reviewed: I have personally  reviewed following labs and imaging studies  CBC:  Recent Labs Lab 01/22/16 0537  01/24/16 0220 01/25/16 0240 01/26/16 0455 01/27/16 0445 01/28/16 0535  WBC 8.6  < > 12.5* 14.1* 13.2* 11.2* 8.4  NEUTROABS 3.5  --   --   --   --   --   --   HGB 11.2*  < > 11.2* 11.1* 10.4* 10.7* 10.7*  HCT 34.5*  < > 37.1 36.6 35.1* 35.6* 34.4*  MCV 87.1  < > 92.3 93.1 93.1 92.0 90.1  PLT 166  < > 164 154 142* 139* 135*  < > = values in this interval not displayed. Basic Metabolic Panel:  Recent Labs Lab 01/22/16 0246 01/22/16 0537 01/23/16 0212 01/24/16 0220 01/25/16 0240 01/26/16 0455 01/27/16 0445 01/28/16 0535  NA 146* 147* 144 145 141 137 137 137  K 3.3* 3.5 4.0 4.0 4.2 4.5 4.0 3.8  CL 107 106 106 107 104 100* 95* 94*  CO2 30 30 32 29 30 33* 35* 35*  GLUCOSE 104* 92 111* 99 77 79 89 87  BUN 71* 67* 36* 26* 24* '14 12 12  '$ CREATININE 1.91* 1.72* 1.22* 1.14* 1.09* 0.85 1.03* 1.18*  CALCIUM 8.0* 8.0* 8.3* 8.4* 8.6* 8.4* 8.8* 9.0  MG 1.4* 1.9 2.5*  --   --   --   --   --   PHOS  --  2.7 2.1*  --   --   --   --   --    GFR: Estimated Creatinine Clearance: 76 mL/min (by C-G formula based on Cr of 1.18). Liver Function Tests: No results for input(s): AST, ALT, ALKPHOS, BILITOT, PROT, ALBUMIN in the last 168 hours. No results for input(s): LIPASE, AMYLASE in the last 168 hours. No results for input(s): AMMONIA in the last 168 hours. Coagulation Profile:  Recent Labs Lab 01/27/16 0445 01/28/16 0535  INR 1.58* 1.99*   Cardiac Enzymes:  Recent Labs Lab 01/21/16 2042 01/22/16 0247 01/22/16 0537 01/22/16 1312 01/22/16 1616  CKTOTAL 48  --   --   --   --   TROPONINI  --  0.44* 1.28* 0.71* 0.78*   BNP (last 3 results) No results for input(s): PROBNP in the last 8760 hours. HbA1C: No results for input(s): HGBA1C in the last 72 hours. CBG: No results for input(s): GLUCAP in the last 168 hours. Lipid Profile: No results for input(s): CHOL, HDL, LDLCALC, TRIG, CHOLHDL,  LDLDIRECT in the last 72 hours. Thyroid Function Tests: No results for input(s): TSH, T4TOTAL, FREET4, T3FREE, THYROIDAB in the last 72 hours. Anemia Panel: No results for input(s): VITAMINB12, FOLATE, FERRITIN, TIBC, IRON, RETICCTPCT in the last 72 hours. Urine analysis:    Component Value Date/Time   COLORURINE YELLOW 01/21/2016 0840   APPEARANCEUR CLOUDY* 01/21/2016 0840   LABSPEC 1.019 01/21/2016 0840   PHURINE 5.0 01/21/2016 0840   GLUCOSEU NEGATIVE 01/21/2016 0840   HGBUR NEGATIVE 01/21/2016 0840   BILIRUBINUR NEGATIVE 01/21/2016 0840   KETONESUR 15* 01/21/2016 0840   PROTEINUR NEGATIVE 01/21/2016 0840   NITRITE NEGATIVE 01/21/2016 0840   LEUKOCYTESUR NEGATIVE 01/21/2016 0840   Sepsis Labs: '@LABRCNTIP'$ (procalcitonin:4,lacticidven:4)  ) Recent Results (from the past 240 hour(s))  Culture, blood (x 2)     Status: None   Collection Time: 01/19/16 10:37 PM  Result Value Ref Range Status   Specimen Description BLOOD BLOOD LEFT HAND  Final   Special Requests IN PEDIATRIC BOTTLE 1CC  Final   Culture NO GROWTH 5 DAYS  Final   Report Status 01/24/2016 FINAL  Final  Culture, blood (x 2)     Status: None   Collection Time: 01/19/16 10:55 PM  Result Value Ref Range Status   Specimen Description BLOOD RIGHT ARM  Final   Special Requests IN PEDIATRIC BOTTLE 1CC  Final   Culture NO GROWTH 5 DAYS  Final   Report Status 01/24/2016 FINAL  Final  MRSA PCR Screening     Status: None   Collection Time: 01/20/16 12:29 AM  Result Value Ref Range Status   MRSA by PCR NEGATIVE NEGATIVE Final    Comment:        The GeneXpert MRSA Assay (FDA approved for NASAL specimens only), is one component of a comprehensive MRSA colonization surveillance program. It is not intended to diagnose MRSA infection nor to guide or monitor treatment for MRSA infections.       Radiology Studies: No results found.   Scheduled Meds: . aspirin EC  81  mg Oral Daily  . atorvastatin  40 mg Oral q1800    . levothyroxine  175 mcg Oral QAC breakfast  . mometasone-formoterol  2 puff Inhalation BID  . pantoprazole  40 mg Oral Q1200  . rivaroxaban  15 mg Oral BID WC  . sodium chloride flush  10-40 mL Intracatheter Q12H  . spironolactone  25 mg Oral Daily  . torsemide  50 mg Oral BID  . warfarin  2.5 mg Oral ONCE-1800  . Warfarin - Pharmacist Dosing Inpatient   Does not apply q1800   Continuous Infusions: . heparin 1,300 Units/hr (01/28/16 1027)     LOS: 9 days   Time Spent in minutes   30 minutes  Burnette Sautter D.O. on 01/28/2016 at 3:00 PM  Between 7am to 7pm - Pager - [phone]  After 7pm go to [email or web address] - password [email or web address]  And look for the night coverage person covering for me after hours  [facility] Office  [phone]

## 2016-01-28 NOTE — Evaluation (Signed)
Physical Therapy Evaluation Patient Details Name: Bonnie Krause MRN: [id number] DOB: 1966-02-28 Today's Date: 01/28/2016   History of Present Illness  Bonnie Krause is a 50 y.o. female with PMH as outlined below. She was seen by PCP 7/10 for N/V/D, abd pain, generalized weakness x 3 - 4 weeks. At PCP office, she was found to be hypotensive so was sent to [facility] for further evaluation.PCCM was called later that evening for persistent hypotension with SBP in 70's - 80's. Of note, she has remained awake with normal mental status since admission and state she occasionally has low BP but is unable to tell me what her pressures typically run.She states N/V/D have been going on for roughly 3 - 4 weeks and have been associated with abd pain. Diarrhea has been as many as 8 times per night, vomiting 2 - 3 times per day. She has not tolerated any food as she has never been hungry. Has been trying to drink as much water as possible, occasionally able to keep it down. Has had intermittent subjective fevers and chills. Denies any hematochezia or melena..  Pt does have right LE DVT.   Clinical Impression  Pt admitted with above diagnosis. Pt currently with functional limitations due to the deficits listed below (see PT Problem List). Pt was able to ambulate with RW with much less pain.  Pt surprised she can walk.  Should progress well.  Will follow acutely.   Pt will benefit from skilled PT to increase their independence and safety with mobility to allow discharge to the venue listed below.      Follow Up Recommendations Home health PT;Supervision/Assistance - 24 hour    Equipment Recommendations  Rolling walker with 5" wheels;3in1 (PT)    Recommendations for Other Services       Precautions / Restrictions Precautions Precautions: Fall Restrictions Weight Bearing Restrictions: No      Mobility  Bed Mobility Overal bed mobility: Independent                 Transfers Overall transfer level: Needs assistance Equipment used: Rolling walker (2 wheeled) Transfers: Sit to/from Stand Sit to Stand: Min assist         General transfer comment: Needed steadying assist upon initial stand.  HAd pt march in place and then began ambualtion.  Ambulation/Gait Ambulation/Gait assistance: Min guard Ambulation Distance (Feet): 15 Feet Assistive device: Rolling walker (2 wheeled) Gait Pattern/deviations: Step-through pattern;Decreased stride length   Gait velocity interpretation: Below normal speed for age/gender General Gait Details: Pt was able to ambulate with RW without LOB.  Pt very happy she could ambulate.  Pt reports way less pain.    Stairs            Wheelchair Mobility    Modified Rankin (Stroke Patients Only)       Balance Overall balance assessment: Needs assistance Sitting-balance support: No upper extremity supported;Feet supported Sitting balance-Leahy Scale: Fair   Postural control: Posterior lean Standing balance support: Bilateral upper extremity supported;During functional activity Standing balance-Leahy Scale: Poor Standing balance comment: relies on UE support on RW.                              Pertinent Vitals/Pain Pain Assessment: Faces Faces Pain Scale: Hurts little more Pain Location: bil LEs Pain Descriptors / Indicators: Sore Pain Intervention(s): Limited activity within patient's tolerance;Monitored during session;Premedicated before session;Repositioned  VSS    Home Living  Family/patient expects to be discharged to:: Private residence Living Arrangements: Alone Available Help at Discharge: Family;Available 24 hours/day (brother and mother) Type of Home: House Home Access: Stairs to enter Entrance Stairs-Rails: Right;Left;Can reach both Entrance Stairs-Number of Steps: 5 Home Layout: One level Home Equipment: None Additional Comments: works fulltime as Museum/gallery conservator at [employer]     Prior Function Level of Independence: Independent               Higher education careers adviser        Extremity/Trunk Assessment   Upper Extremity Assessment: Defer to OT evaluation           Lower Extremity Assessment: Generalized weakness      Cervical / Trunk Assessment: Normal  Communication   Communication: No difficulties  Cognition Arousal/Alertness: Awake/alert Behavior During Therapy: WFL for tasks assessed/performed Overall Cognitive Status: Within Functional Limits for tasks assessed                      General Comments      Exercises General Exercises - Upper Extremity Shoulder Flexion: AROM;Both;10 reps;Seated Elbow Flexion: AROM;Both;10 reps;Seated General Exercises - Lower Extremity Ankle Circles/Pumps: AROM;Both;10 reps;Seated Quad Sets: AROM;Both;10 reps;Seated Gluteal Sets: AROM;Both;10 reps;Seated Long Arc Quad: AROM;Both;10 reps;Seated      Assessment/Plan    PT Assessment Patient needs continued PT services  PT Diagnosis Generalized weakness   PT Problem List Decreased activity tolerance;Decreased balance;Decreased mobility;Decreased knowledge of use of DME;Decreased safety awareness;Decreased knowledge of precautions  PT Treatment Interventions DME instruction;Gait training;Stair training;Functional mobility training;Therapeutic activities;Therapeutic exercise;Balance training;Patient/family education   PT Goals (Current goals can be found in the Care Plan section) Acute Rehab PT Goals Patient Stated Goal: to go home PT Goal Formulation: With patient Time For Goal Achievement: 02/11/16 Potential to Achieve Goals: Good    Frequency Min 3X/week   Barriers to discharge        Co-evaluation               End of Session Equipment Utilized During Treatment: Gait belt Activity Tolerance: Patient tolerated treatment well Patient left: in chair;with call bell/phone within reach;with nursing/sitter in room Nurse Communication:  Mobility status         Time: 1025-1050 PT Time Calculation (min) (ACUTE ONLY): 25 min   Charges:   PT Evaluation $PT Eval Moderate Complexity: 1 Procedure PT Treatments $Gait Training: 8-22 mins   PT G CodesBerline Lopes 02-18-16, 2:09 PM Kristiane Morsch,PT Acute Rehabilitation [phone] (pager)

## 2016-01-28 NOTE — Progress Notes (Addendum)
DANTICOAGULATION CONSULT NOTE  Pharmacy Consult for heparin + warfarin Indication:  DVT  Allergies  Allergen Reactions  . Penicillins Anaphylaxis and Hives    Patient Measurements: Height: 5\' 4"  (162.6 cm) Weight: 279 lb 4.8 oz (126.69 kg) IBW/kg (Calculated) : 54.7 Heparin Dosing Weight: 85 kg  Vital Signs: Temp: 98.1 F (36.7 C) (07/19 0519) Temp Source: Oral (07/19 0519) BP: 102/53 mmHg (07/19 0519) Pulse Rate: 64 (07/19 0519)  Labs:  Recent Labs  01/26/16 0455 01/27/16 0445 01/28/16 0525 01/28/16 0535  HGB 10.4* 10.7*  --  10.7*  HCT 35.1* 35.6*  --  34.4*  PLT 142* 139*  --  135*  LABPROT  --  18.9*  --  22.5*  INR  --  1.58*  --  1.99*  HEPARINUNFRC 0.45 0.34 0.38  --   CREATININE 0.85 1.03*  --  1.18*    Estimated Creatinine Clearance: 76 mL/min (by C-G formula based on Cr of 1.18).  Assessment: 50 yo female admitted with n/v/d, began having chest pain on 7/11. ECHO completed 7/12 showed elevated PA pressure and R heart stain. Positive for DVT, workup for PE negative. Today is D#3/5 of overlap with heparin + warfarin. -Heparin level remains therapeutic this morning at 0.38. INR 1.99. -Hgb and plts overall stable -appears the coumadin dose on 7/18 was missed   Goal of Therapy:  Heparin level 0.3-0.7 units/ml Monitor platelets by anticoagulation protocol: Yes  Plan:   -Continue heparin at 1300 units/hr -Warfarin 2.5mg  po x1 tonight  -Heparin level, INR and CBC daily   Harland Germanndrew Garmon Dehn, Pharm D 01/28/2016 8:41 AM  Addendum -Change to Xarelto  Plan -Will check on cost of xarelto/apixiban -If affordable will change and begin tonight   Harland Germanndrew Izabelle Daus, Pharm D 01/28/2016 10:21 AM

## 2016-01-28 NOTE — Progress Notes (Signed)
Patient continues to have low blood pressures (87/48).  She is A/O x 4 and has started to develop a dry cough.  She was c/o SOB upon returning to bed from the bathroom and RN applied 2L oxygen via [place].  RN will continue to monitor since, per previous MD note, she has been having hypotension. Patient provided with call bell and instructed to call RN with any needs/concerns. Bradley FerrisBrandie Adesuwa Osgood RN BSN 01/28/2016 8:46 PM

## 2016-01-28 NOTE — Progress Notes (Signed)
Per [employer] check [employer] @ [employer] # [id number] OPT-2   XARELTO 15 MG BID X 21 DAYS    20 MG DAILY  COVER- YES  CO-PAY- 20 % CO-INS            SAME  TIER- 2 DRUG  PRIOR APPROVAL- NO   ELIQUIS 5 MG BID (30 )  COVER- YES  CO-PAY- 20 % C0-INS  TIER- 2 DRUG  PRIOR APPROVAL - NO   *PATIENT HAS NOT MEET HER DEDUCTIBLE:    [facility]

## 2016-01-29 LAB — CBC
HCT: 34.9 % — ABNORMAL LOW (ref 36.0–46.0)
HEMOGLOBIN: 11.2 g/dL — AB (ref 12.0–15.0)
MCH: 28.4 pg (ref 26.0–34.0)
MCHC: 32.1 g/dL (ref 30.0–36.0)
MCV: 88.4 fL (ref 78.0–100.0)
Platelets: 189 10*3/uL (ref 150–400)
RBC: 3.95 MIL/uL (ref 3.87–5.11)
RDW: 15.5 % (ref 11.5–15.5)
WBC: 8.1 10*3/uL (ref 4.0–10.5)

## 2016-01-29 MED ORDER — RIVAROXABAN 20 MG PO TABS: 20.0000 mg | ORAL_TABLET | Freq: Every day | ORAL | Status: DC

## 2016-01-29 MED ORDER — RIVAROXABAN 15 MG PO TABS
15.0000 mg | ORAL_TABLET | Freq: Two times a day (BID) | ORAL | Status: DC
  Administered 2016-01-29 – 2016-01-30 (×3): 15 mg via ORAL
  Filled 2016-01-29 (×4): qty 1

## 2016-01-29 MED ORDER — FLUCONAZOLE IN SODIUM CHLORIDE 100-0.9 MG/50ML-% IV SOLN
100.0000 mg | INTRAVENOUS | Status: DC
  Administered 2016-01-29: 100 mg via INTRAVENOUS
  Filled 2016-01-29 (×3): qty 50

## 2016-01-29 NOTE — Plan of Care (Signed)
Problem: Skin Integrity: Goal: Risk for impaired skin integrity will decrease Outcome: Progressing Patient continues to have irritated areas between thighs/legs. Microguard powder provided and applied to red areas.

## 2016-01-29 NOTE — Care Management Note (Signed)
Case Management Note Marvetta Gibbons RN, BSN Unit 2W-Case Manager [phone]  Patient Details  Name: Bonnie Krause MRN: [id number] Date of Birth: [birth year]  Subjective/Objective:    Pt admitted with ARF and pulm. HTN, also found acute DVT               Action/Plan: PTA pt lived at home- independent- anticipate return home- with [facility] services per recommendations from PT- pt has been started on Xarelto- per insurance check-[facility] @ [employer] # [phone] OPT-2   ZARELTO 15 MG BID X 21 DAYS    20 MG DAILY  COVER- YES  CO-PAY- 20 % CO-INS            SAME  TIER- 2 DRUG  PRIOR APPROVAL- NO   Spoke with pt at bedside- coverage info shared with pt on Xarelto- pt given 30 day free card- and explained where to go for starter kit fill [facility] or [facility] [address]-  Pt also agreeable to HHPT per recommendations- feels like it would benefit her- choice offered for [facility] agencies in [facility]- per pt she would like to use agency affiliated with hospital which would be [facility]- pt also would like RW for discharge- pt will need orders for HHPT and DME for discharge- CM to follow - referral for  [facility] services called to Manuela Schwartz with [facility]- awaiting orders   Expected Discharge Date:                  Expected Discharge Plan:  [place]  In-House Referral:     Discharge planning Services  CM Consult, Medication Assistance  Post Acute Care Choice:  Home Health, Durable Medical Equipment Choice offered to:  Patient  DME Arranged:  Walker rolling DME Agency:     HH Arranged:  PT [place]:  [place]  Status of Service:  In process, will continue to follow  If discussed at Long Length of Stay Meetings, dates discussed:    Additional Comments:  Dawayne Patricia, RN 01/29/2016, 11:18 AM

## 2016-01-29 NOTE — Progress Notes (Signed)
[facility] responded to Consult regarding Advance Directive. Patient had previously received the information and had already filled out the forms. I contacted the Notary and the form was documented. I offered a prayer of thanksgiving. I am available for follow up as needed.  Lorne SkeensBenjamin T Aris Moman    01/29/16 1000  Clinical Encounter Type  Visited With Patient  Visit Type Initial;Spiritual support;Social support;Other (Comment) (AD)  Referral From Nurse  Spiritual Encounters  Spiritual Needs Literature;Prayer

## 2016-01-29 NOTE — Progress Notes (Signed)
Physical Therapy Treatment Patient Details Name: Bonnie Krause MRN: [id number] DOB: [birth year] Today's Date: 01/29/2016    History of Present Illness Bonnie Krause is a 50 y.o. female with PMH as outlined below. She was seen by PCP 7/10 for N/V/D, abd pain, generalized weakness x 3 - 4 weeks. At PCP office, she was found to be hypotensive so was sent to [facility] for further evaluation.PCCM was called later that evening for persistent hypotension with SBP in 70's - 80's. Of note, she has remained awake with normal mental status since admission and state she occasionally has low BP but is unable to tell me what her pressures typically run.She states N/V/D have been going on for roughly 3 - 4 weeks and have been associated with abd pain. Diarrhea has been as many as 8 times per night, vomiting 2 - 3 times per day. She has not tolerated any food as she has never been hungry. Has been trying to drink as much water as possible, occasionally able to keep it down. Has had intermittent subjective fevers and chills. Denies any hematochezia or melena..  Pt does have right LE DVT.     PT Comments    Pt admitted with above diagnosis. Pt currently with functional limitations due to the deficits listed below (see PT Problem List). Pt was able to ambulate in hallway today increasing distance.  Desat to 88% on RA and DOE3/4.  Once pt back in chair, sat 95% on RA. BP 91/60 standing with pt c/o dizziness.  Got better once pt was sitting.   Will continue to progress pt as she tolerates.  Pt will benefit from skilled PT to increase their independence and safety with mobility to allow discharge to the venue listed below.    Follow Up Recommendations  Home health PT;Supervision/Assistance - 24 hour     Equipment Recommendations  Rolling walker with 5" wheels;3in1 (PT)    Recommendations for Other Services       Precautions / Restrictions Precautions Precautions:  Fall Restrictions Weight Bearing Restrictions: No    Mobility  Bed Mobility               General bed mobility comments: in chair on arrival  Transfers Overall transfer level: Needs assistance Equipment used: Rolling walker (2 wheeled) Transfers: Sit to/from Stand Sit to Stand: Min guard         General transfer comment: No steadying asssist needed to stand.   Ambulation/Gait Ambulation/Gait assistance: Min assist;Min guard;+2 safety/equipment Ambulation Distance (Feet): 170 Feet (110 feet then 60 feet) Assistive device: Rolling walker (2 wheeled) Gait Pattern/deviations: Step-through pattern;Decreased stride length   Gait velocity interpretation: Below normal speed for age/gender General Gait Details: Pt ambulated with RW with occasional cues to sequence steps and RW. Pt needed sevveral standing rest breaks and one seated rest break.  Followed pt with chair so she could have rest breaks.  Pt dizzy as well with BP slightly low and desat to 88% on RA with ambulation which improved with rest to >90%.  Pt wanted to keep walking however with numbers fluctuating, decided it best for pt to rest a while and get up again later.     Stairs            Wheelchair Mobility    Modified Rankin (Stroke Patients Only)       Balance Overall balance assessment: Needs assistance Sitting-balance support: No upper extremity supported;Feet supported Sitting balance-Leahy Scale: Fair   Postural control: Posterior  lean Standing balance support: Bilateral upper extremity supported;During functional activity Standing balance-Leahy Scale: Poor Standing balance comment: relies on RW for UE support and balance.                     Cognition Arousal/Alertness: Awake/alert Behavior During Therapy: WFL for tasks assessed/performed Overall Cognitive Status: Within Functional Limits for tasks assessed                      Exercises General Exercises - Upper  Extremity Shoulder Flexion: AROM;Both;10 reps;Seated General Exercises - Lower Extremity Ankle Circles/Pumps: AROM;Both;10 reps;Seated Quad Sets: AROM;Both;10 reps;Seated Long Arc Quad: AROM;Both;10 reps;Seated    General Comments        Pertinent Vitals/Pain Pain Assessment: 0-10 Pain Score: 10-Worst pain ever Pain Location: 8/10 right LE and 10/10 left LE per pt Pain Descriptors / Indicators: Aching;Sore Pain Intervention(s): Limited activity within patient's tolerance;Monitored during session;Repositioned;Patient requesting pain meds-RN notified (nurse waiting on meds to get to floor)   Desat to 88% on RA and DOE3/4.  Once pt back in chair, sat 95% on RA. BP 91/60 standing with pt c/o dizziness. Home Living                      Prior Function            PT Goals (current goals can now be found in the care plan section) Progress towards PT goals: Progressing toward goals    Frequency  Min 3X/week    PT Plan Current plan remains appropriate    Co-evaluation             End of Session Equipment Utilized During Treatment: Gait belt Activity Tolerance: Patient tolerated treatment well Patient left: in chair;with call bell/phone within reach;with family/visitor present     Time: 1610-9604 PT Time Calculation (min) (ACUTE ONLY): 17 min  Charges:  $Gait Training: 8-22 mins                    G CodesBerline Krause 02/19/2016, 3:41 PM [employer] Acute Rehabilitation [phone])

## 2016-01-29 NOTE — Progress Notes (Signed)
PROGRESS NOTE    Bonnie Krause  WCB:[id number] DOB: 1966/03/16 DOA: 01/19/2016 PCP: Ronita Hipps, MD   Brief Narrative:  Bonnie Krause is a 50 y.o. female with PMH as outlined below. She was seen by PCP 7/10 for N/V/D, abd pain, generalized weakness x 3 - 4 weeks. At PCP office, she was found to be hypotensive so was sent to [facility] for further evaluation.  At [facility], she had BP in 70's and was found to have acute renal failure with SCr 9.50. She was transferred to [facility] for further evaluation and management and was admitted by [facility] team.  PCCM was called later that evening for persistent hypotension with SBP in 70's - 80's. Of note, she has remained awake with normal mental status since admission and state she occasionally has low BP but is unable to tell me what her pressures typically run.  She states N/V/D have been going on for roughly 3 - 4 weeks and have been associated with abd pain. Diarrhea has been as many as 8 times per night, vomiting 2 - 3 times per day. She has not tolerated any food as she has never been hungry. Has been trying to drink as much water as possible, occasionally able to keep it down. Has had intermittent subjective fevers and chills. Denies any hematochezia or melena. Has had dull chest tightness / pain with radiation into left arm with numbness / tingling that started at the same time her N/V/D did 3 - 4 weeks ago. This has not changed since in character or frequency. Denies lightheadedness, diaphoresis, syncope.  Assessment & Plan   Hypotension secondary to hypovolemia -Blood pressures low-normal, she is on spironolactone 25 mg daily and torsemide 50 mg twice a day  Chest pain -Cardiology to consulted and appreciated -Status post cardiac catheterization, no CAD -Lisinopril held -Continue aspirin, statin  Pulmonary hypertension -Continue torsemide, spironolactone -Monitor intake and output, daily weights -serologies  unremarkable (ESR, CRP, ANA, ANCA, Anti-Scl-70, Anti-sm, RF, CK) -Patient will need outpatient follow-up with pulmonology  Right lower extremity DVT -She had been on IV heparin, plan to transition to oral anticoagulation with Xarelto. Pharmacy consulted  Acute kidney injury/metabolic acidosis -She has had intermittent improvement to her renal function, with a.m. lab work on 01/28/2016 showing creatinine 1.18 with BUN of 12. -Suspect that acute renal failure likely precipitated by dehydration/hypovolemia possibly from ATN in setting of hypotension. Patient responded to IV fluids -Monitor intake and output  Nausea -Resolved, continue antiemetics as needed  Obstructive sleep apnea -Continue CPAP QHS  Colitis -Resolved, currently off of antibiotics  Hypothyroidism -Continue Synthroid, TSH 2.2  Bradycardia  Hyperlipidemia -Continue statin  Left foot pain -Continue pain control  DVT Prophylaxis: She is fully anticoagulated  Code Status: Limited code  Family Communication: Spoke to her mother was present at bedside  Disposition Plan: Anticipate discharge in the next 24-48 hours  Consultants PCCM (signed off) Cardiology (signed off)  Procedures  7/10 renal US negative 7/12 Echo: EF 65 to 61%, grade 2 diastolic CHF, mild AR, mild MR, severe RV dilation, mod TR, PAS 114 mmHg 7/13 V/Q scan: normal 7/13 B/l LE doppler U/S: small segment of DVT in the R popliteal v. 7/14 Lt and Rt heart cath >> no CAD; elevated PA pressures 7/15 CTA chest > insufficient pulmonary arterial opacification for diagnostic assessment of acute PE.  Antibiotics   Anti-infectives    Start     Dose/Rate Route Frequency Ordered Stop   01/22/16 1000  ciprofloxacin (  CIPRO) IVPB 400 mg  Status:  Discontinued    Comments:  Cipro 400 mg IV q24h for CrCl < 30 mL/min   400 mg 200 mL/hr over 60 Minutes Intravenous Every 12 hours 01/22/16 0820 01/22/16 1107   01/20/16 2200  ciprofloxacin (CIPRO) IVPB 400 mg   Status:  Discontinued    Comments:  Cipro 400 mg IV q24h for CrCl < 30 mL/min   400 mg 200 mL/hr over 60 Minutes Intravenous Daily at bedtime 01/19/16 2310 01/22/16 0820   01/20/16 1000  fluconazole (DIFLUCAN) tablet 100 mg     100 mg Oral Daily 01/19/16 2254 01/26/16 1036   01/19/16 2245  ciprofloxacin (CIPRO) IVPB 400 mg  Status:  Discontinued     400 mg 200 mL/hr over 60 Minutes Intravenous Every 12 hours 01/19/16 2231 01/19/16 2310   01/19/16 2230  metroNIDAZOLE (FLAGYL) IVPB 500 mg  Status:  Discontinued     500 mg 100 mL/hr over 60 Minutes Intravenous Every 8 hours 01/19/16 2229 01/22/16 1107      Subjective:   [name] thinks she is doing a little better today, denies shortness of breath or chest pain. Tolerating by mouth intake.  Objective:   Filed Vitals:   01/28/16 2312 01/29/16 0417 01/29/16 0915 01/29/16 1428  BP:  98/61  88/41  Pulse: 84 67 73 74  Temp:  98.4 F (36.9 C)  97.8 F (36.6 C)  TempSrc:  Axillary  Oral  Resp: _0 Height:      Weight:  119.795 kg (264 lb 1.6 oz)    SpO2: 95% 94% 93% 95%    Intake/Output Summary (Last 24 hours) at 01/29/16 1629 Last data filed at 01/29/16 1035  Gross per 24 hour  Intake    260 ml  Output   1020 ml  Net   -760 ml   Filed Weights   01/27/16 0518 01/28/16 0519 01/29/16 0417  Weight: 126.962 kg (279 lb 14.4 oz) 126.69 kg (279 lb 4.8 oz) 119.795 kg (264 lb 1.6 oz)    Exam  General: Well developed, well nourished, NAD, appears stated age  HEENT: NCAT, mucous membranes moist.   Neck: Supple, no JVD, no masses  Cardiovascular: S1 S2 auscultated, RRR, no murmur  Respiratory: Clear to auscultation bilaterally with equal chest rise, no wheezing  Abdomen: Soft, nontender, nondistended, + bowel sounds  Extremities: warm dry without cyanosis clubbing. +1 edema LLE > RLE  Neuro: AAOx3, nonfocal  Skin: Without rashes exudates or nodules  Psych: Normal affect and demeanor with intact judgement and  insight   Data Reviewed: I have personally reviewed following labs and imaging studies  CBC:  Recent Labs Lab 01/25/16 0240 01/26/16 0455 01/27/16 0445 01/28/16 0535 01/29/16 0430  WBC 14.1* 13.2* 11.2* 8.4 8.1  HGB 11.1* 10.4* 10.7* 10.7* 11.2*  HCT 36.6 35.1* 35.6* 34.4* 34.9*  MCV 93.1 93.1 92.0 90.1 88.4  PLT 154 142* 139* 135* 010   Basic Metabolic Panel:  Recent Labs Lab 01/23/16 0212 01/24/16 0220 01/25/16 0240 01/26/16 0455 01/27/16 0445 01/28/16 0535  NA 144 145 141 137 137 137  K 4.0 4.0 4.2 4.5 4.0 3.8  CL 106 107 104 100* 95* 94*  CO2 32 29 30 33* 35* 35*  GLUCOSE 111* 99 77 79 89 87  BUN 36* 26* 24* _1 CREATININE 1.22* 1.14* 1.09* 0.85 1.03* 1.18*  CALCIUM 8.3* 8.4* 8.6* 8.4* 8.8* 9.0  MG 2.5*  --   --   --   --   --  PHOS 2.1*  --   --   --   --   --    GFR: Estimated Creatinine Clearance: 73.5 mL/min (by C-G formula based on Cr of 1.18). Liver Function Tests: No results for input(s): AST, ALT, ALKPHOS, BILITOT, PROT, ALBUMIN in the last 168 hours. No results for input(s): LIPASE, AMYLASE in the last 168 hours. No results for input(s): AMMONIA in the last 168 hours. Coagulation Profile:  Recent Labs Lab 01/27/16 0445 01/28/16 0535  INR 1.58* 1.99*   Cardiac Enzymes: No results for input(s): CKTOTAL, CKMB, CKMBINDEX, TROPONINI in the last 168 hours. BNP (last 3 results) No results for input(s): PROBNP in the last 8760 hours. HbA1C: No results for input(s): HGBA1C in the last 72 hours. CBG: No results for input(s): GLUCAP in the last 168 hours. Lipid Profile: No results for input(s): CHOL, HDL, LDLCALC, TRIG, CHOLHDL, LDLDIRECT in the last 72 hours. Thyroid Function Tests: No results for input(s): TSH, T4TOTAL, FREET4, T3FREE, THYROIDAB in the last 72 hours. Anemia Panel: No results for input(s): VITAMINB12, FOLATE, FERRITIN, TIBC, IRON, RETICCTPCT in the last 72 hours. Urine analysis:    Component Value Date/Time    COLORURINE YELLOW 01/28/2016 1504   APPEARANCEUR CLEAR 01/28/2016 1504   LABSPEC 1.008 01/28/2016 1504   PHURINE 8.0 01/28/2016 1504   GLUCOSEU NEGATIVE 01/28/2016 1504   HGBUR NEGATIVE 01/28/2016 1504   BILIRUBINUR NEGATIVE 01/28/2016 1504   KETONESUR NEGATIVE 01/28/2016 1504   PROTEINUR NEGATIVE 01/28/2016 1504   NITRITE NEGATIVE 01/28/2016 1504   LEUKOCYTESUR NEGATIVE 01/28/2016 1504   Sepsis Labs: _0 (procalcitonin:4,lacticidven:4)  ) Recent Results (from the past 240 hour(s))  Culture, blood (x 2)     Status: None   Collection Time: 01/19/16 10:37 PM  Result Value Ref Range Status   Specimen Description BLOOD BLOOD LEFT HAND  Final   Special Requests IN PEDIATRIC BOTTLE 1CC  Final   Culture NO GROWTH 5 DAYS  Final   Report Status 01/24/2016 FINAL  Final  Culture, blood (x 2)     Status: None   Collection Time: 01/19/16 10:55 PM  Result Value Ref Range Status   Specimen Description BLOOD RIGHT ARM  Final   Special Requests IN PEDIATRIC BOTTLE 1CC  Final   Culture NO GROWTH 5 DAYS  Final   Report Status 01/24/2016 FINAL  Final  MRSA PCR Screening     Status: None   Collection Time: 01/20/16 12:29 AM  Result Value Ref Range Status   MRSA by PCR NEGATIVE NEGATIVE Final    Comment:        The GeneXpert MRSA Assay (FDA approved for NASAL specimens only), is one component of a comprehensive MRSA colonization surveillance program. It is not intended to diagnose MRSA infection nor to guide or monitor treatment for MRSA infections.       Radiology Studies: No results found.   Scheduled Meds: . aspirin EC  81 mg Oral Daily  . atorvastatin  40 mg Oral q1800  . levothyroxine  175 mcg Oral QAC breakfast  . mometasone-formoterol  2 puff Inhalation BID  . pantoprazole  40 mg Oral Q1200  . rivaroxaban  15 mg Oral BID WC   Followed by  . [START ON 02/19/2016] rivaroxaban  20 mg Oral Q supper  . sodium chloride flush  10-40 mL Intracatheter Q12H  .  spironolactone  25 mg Oral Daily  . torsemide  50 mg Oral BID   Continuous Infusions:     LOS: 10 days  Time Spent in minutes   15 minutes  Anasofia Micallef D.O. on 01/29/2016 at 4:29 PM  Between 7am to 7pm - Pager - [phone]  After 7pm go to [email or web address] - password [email or web address]  And look for the night coverage person covering for me after hours  [facility] Office  [phone]

## 2016-01-30 LAB — CBC
HEMATOCRIT: 37.6 % (ref 36.0–46.0)
Hemoglobin: 11.9 g/dL — ABNORMAL LOW (ref 12.0–15.0)
MCH: 28 pg (ref 26.0–34.0)
MCHC: 31.6 g/dL (ref 30.0–36.0)
MCV: 88.5 fL (ref 78.0–100.0)
PLATELETS: 211 10*3/uL (ref 150–400)
RBC: 4.25 MIL/uL (ref 3.87–5.11)
RDW: 15.7 % — AB (ref 11.5–15.5)
WBC: 8.2 10*3/uL (ref 4.0–10.5)

## 2016-01-30 LAB — BASIC METABOLIC PANEL
ANION GAP: 9 (ref 5–15)
BUN: 17 mg/dL (ref 6–20)
CO2: 31 mmol/L (ref 22–32)
CREATININE: 1.34 mg/dL — AB (ref 0.44–1.00)
Calcium: 9.1 mg/dL (ref 8.9–10.3)
Chloride: 93 mmol/L — ABNORMAL LOW (ref 101–111)
GFR calc Af Amer: 53 mL/min — ABNORMAL LOW (ref >60)
GFR, EST NON AFRICAN AMERICAN: 46 mL/min — AB (ref >60)
Glucose, Bld: 85 mg/dL (ref 65–99)
Potassium: 6.1 mmol/L — ABNORMAL HIGH (ref 3.5–5.1)
Sodium: 133 mmol/L — ABNORMAL LOW (ref 135–145)

## 2016-01-30 MED ORDER — INSULIN ASPART 100 UNIT/ML ~~LOC~~ SOLN
10.0000 [IU] | Freq: Once | SUBCUTANEOUS | Status: AC
  Administered 2016-01-30: 10 [IU] via SUBCUTANEOUS

## 2016-01-30 MED ORDER — DEXTROSE 50 % IV SOLN
1.0000 | Freq: Once | INTRAVENOUS | Status: AC
  Administered 2016-01-30: 50 mL via INTRAVENOUS
  Filled 2016-01-30: qty 50

## 2016-01-30 MED ORDER — FLUCONAZOLE 100 MG PO TABS
150.0000 mg | ORAL_TABLET | Freq: Once | ORAL | Status: AC
  Administered 2016-01-30: 150 mg via ORAL
  Filled 2016-01-30: qty 2

## 2016-01-30 MED ORDER — DOCUSATE SODIUM 100 MG PO CAPS
100.0000 mg | ORAL_CAPSULE | Freq: Two times a day (BID) | ORAL | Status: DC
  Administered 2016-01-30 (×2): 100 mg via ORAL
  Filled 2016-01-30 (×3): qty 1

## 2016-01-30 MED ORDER — LACTULOSE 10 GM/15ML PO SOLN
20.0000 g | Freq: Two times a day (BID) | ORAL | Status: DC | PRN
  Administered 2016-01-30: 20 g via ORAL
  Filled 2016-01-30: qty 30

## 2016-01-30 MED ORDER — POLYETHYLENE GLYCOL 3350 17 G PO PACK
17.0000 g | PACK | Freq: Every day | ORAL | Status: DC
  Filled 2016-01-30: qty 1

## 2016-01-30 NOTE — Progress Notes (Signed)
PROGRESS NOTE    Bonnie Krause  MWU:[id number] DOB: 12-19-1965 DOA: 01/19/2016 PCP: Ronita Hipps, MD   Brief Narrative:  Bonnie Krause is a 50 y.o. female with PMH as outlined below. She was seen by PCP 7/10 for N/V/D, abd pain, generalized weakness x 3 - 4 weeks. At PCP office, she was found to be hypotensive so was sent to [facility] for further evaluation.  At [facility], she had BP in 70's and was found to have acute renal failure with SCr 9.50. She was transferred to [facility] for further evaluation and management and was admitted by [facility] team.  PCCM was called later that evening for persistent hypotension with SBP in 70's - 80's. Of note, she has remained awake with normal mental status since admission and state she occasionally has low BP but is unable to tell me what her pressures typically run.  She states N/V/D have been going on for roughly 3 - 4 weeks and have been associated with abd pain. Diarrhea has been as many as 8 times per night, vomiting 2 - 3 times per day. She has not tolerated any food as she has never been hungry. Has been trying to drink as much water as possible, occasionally able to keep it down. Has had intermittent subjective fevers and chills. Denies any hematochezia or melena. Has had dull chest tightness / pain with radiation into left arm with numbness / tingling that started at the same time her N/V/D did 3 - 4 weeks ago. This has not changed since in character or frequency. Denies lightheadedness, diaphoresis, syncope.  Assessment & Plan   Hypotension secondary to hypovolemia -Blood pressures low-normal -Spironolactone was stopped on 01/29/2026   Hyperkalemia -Lab work showing potassium of 6.1, suspect secondary to spironolactone for which this medication was discontinued -She was given amp of D50 with IV insulin -Repeat labs in a.m.  Chest pain -Cardiology to consulted and appreciated -Status post cardiac catheterization, no  CAD -Lisinopril held -Continue aspirin, statin  Pulmonary hypertension -Continue torsemide -Monitor intake and output, daily weights -serologies unremarkable (ESR, CRP, ANA, ANCA, Anti-Scl-70, Anti-sm, RF, CK) -Patient will need outpatient follow-up with pulmonology  Right lower extremity DVT -She had been on IV heparin, plan to transition to oral anticoagulation with Xarelto. Pharmacy consulted  Acute kidney injury/metabolic acidosis -She has had intermittent improvement to her renal function, with a.m. lab work on 01/28/2016 showing creatinine 1.18 with BUN of 12. -Suspect that acute renal failure likely precipitated by dehydration/hypovolemia possibly from ATN in setting of hypotension. Patient responded to IV fluids -On 01/30/2016 having a trend in her creatinine to 3.4, spironolactone was discontinued for hyperkalemia  Nausea -Resolved, continue antiemetics as needed  Obstructive sleep apnea -Continue CPAP QHS  Colitis -Resolved, currently off of antibiotics  Hypothyroidism -Continue Synthroid, TSH 2.2  Bradycardia  Hyperlipidemia -Continue statin  Left foot pain -Continue pain control  DVT Prophylaxis: She is fully anticoagulated  Code Status: Limited code  Family Communication: Spoke to her mother was present at bedside  Disposition Plan: Anticipate discharge in the next 24-48 hours  Consultants PCCM (signed off) Cardiology (signed off)  Procedures  7/10 renal US negative 7/12 Echo: EF 65 to 72%, grade 2 diastolic CHF, mild AR, mild MR, severe RV dilation, mod TR, PAS 114 mmHg 7/13 V/Q scan: normal 7/13 B/l LE doppler U/S: small segment of DVT in the R popliteal v. 7/14 Lt and Rt heart cath >> no CAD; elevated PA pressures 7/15 CTA chest > insufficient  pulmonary arterial opacification for diagnostic assessment of acute PE.  Antibiotics   Anti-infectives    Start     Dose/Rate Route Frequency Ordered Stop   01/30/16 1000  fluconazole (DIFLUCAN)  tablet 150 mg     150 mg Oral  Once 01/30/16 0923 01/30/16 0957   01/29/16 1645  fluconazole (DIFLUCAN) IVPB 100 mg  Status:  Discontinued     100 mg 50 mL/hr over 60 Minutes Intravenous Every 24 hours 01/29/16 1632 01/30/16 0923   01/22/16 1000  ciprofloxacin (CIPRO) IVPB 400 mg  Status:  Discontinued    Comments:  Cipro 400 mg IV q24h for CrCl < 30 mL/min   400 mg 200 mL/hr over 60 Minutes Intravenous Every 12 hours 01/22/16 0820 01/22/16 1107   01/20/16 2200  ciprofloxacin (CIPRO) IVPB 400 mg  Status:  Discontinued    Comments:  Cipro 400 mg IV q24h for CrCl < 30 mL/min   400 mg 200 mL/hr over 60 Minutes Intravenous Daily at bedtime 01/19/16 2310 01/22/16 0820   01/20/16 1000  fluconazole (DIFLUCAN) tablet 100 mg     100 mg Oral Daily 01/19/16 2254 01/26/16 1036   01/19/16 2245  ciprofloxacin (CIPRO) IVPB 400 mg  Status:  Discontinued     400 mg 200 mL/hr over 60 Minutes Intravenous Every 12 hours 01/19/16 2231 01/19/16 2310   01/19/16 2230  metroNIDAZOLE (FLAGYL) IVPB 500 mg  Status:  Discontinued     500 mg 100 mL/hr over 60 Minutes Intravenous Every 8 hours 01/19/16 2229 01/22/16 1107      Subjective:   Bonnie Krause reports nausea, states having last bowel movement about a week ago  Objective:   Filed Vitals:   01/29/16 0915 01/29/16 1428 01/29/16 2030 01/30/16 0519  BP:  '88/41 85/50 96/56 '$  Pulse: 73 74 68 73  Temp:  97.8 F (36.6 C) 97.6 F (36.4 C) 97.5 F (36.4 C)  TempSrc:  Oral Oral Axillary  Resp: '20 20 20 18  '$ Height:      Weight:    116.574 kg (257 lb)  SpO2: 93% 95% 98% 98%    Intake/Output Summary (Last 24 hours) at 01/30/16 1514 Last data filed at 01/30/16 0745  Gross per 24 hour  Intake    360 ml  Output    550 ml  Net   -190 ml   Filed Weights   01/28/16 0519 01/29/16 0417 01/30/16 0519  Weight: 126.69 kg (279 lb 4.8 oz) 119.795 kg (264 lb 1.6 oz) 116.574 kg (257 lb)    Exam  General: Well developed, well nourished, NAD, appears stated  age  HEENT: NCAT, mucous membranes moist.   Neck: Supple, no JVD, no masses  Cardiovascular: S1 S2 auscultated, RRR, no murmur  Respiratory: Clear to auscultation bilaterally with equal chest rise, no wheezing  Abdomen: Soft, nontender, nondistended, + bowel sounds  Extremities: warm dry without cyanosis clubbing. +1 edema LLE > RLE  Neuro: AAOx3, nonfocal  Skin: Without rashes exudates or nodules  Psych: Normal affect and demeanor with intact judgement and insight   Data Reviewed: I have personally reviewed following labs and imaging studies  CBC:  Recent Labs Lab 01/26/16 0455 01/27/16 0445 01/28/16 0535 01/29/16 0430 01/30/16 0415  WBC 13.2* 11.2* 8.4 8.1 8.2  HGB 10.4* 10.7* 10.7* 11.2* 11.9*  HCT 35.1* 35.6* 34.4* 34.9* 37.6  MCV 93.1 92.0 90.1 88.4 88.5  PLT 142* 139* 135* 189 952   Basic Metabolic Panel:  Recent Labs Lab 01/25/16 0240 01/26/16  3546 01/27/16 0445 01/28/16 0535 01/30/16 0415  NA 141 137 137 137 133*  K 4.2 4.5 4.0 3.8 6.1*  CL 104 100* 95* 94* 93*  CO2 30 33* 35* 35* 31  GLUCOSE 77 79 89 87 85  BUN 24* '14 12 12 17  '$ CREATININE 1.09* 0.85 1.03* 1.18* 1.34*  CALCIUM 8.6* 8.4* 8.8* 9.0 9.1   GFR: Estimated Creatinine Clearance: 63.7 mL/min (by C-G formula based on Cr of 1.34). Liver Function Tests: No results for input(s): AST, ALT, ALKPHOS, BILITOT, PROT, ALBUMIN in the last 168 hours. No results for input(s): LIPASE, AMYLASE in the last 168 hours. No results for input(s): AMMONIA in the last 168 hours. Coagulation Profile:  Recent Labs Lab 01/27/16 0445 01/28/16 0535  INR 1.58* 1.99*   Cardiac Enzymes: No results for input(s): CKTOTAL, CKMB, CKMBINDEX, TROPONINI in the last 168 hours. BNP (last 3 results) No results for input(s): PROBNP in the last 8760 hours. HbA1C: No results for input(s): HGBA1C in the last 72 hours. CBG: No results for input(s): GLUCAP in the last 168 hours. Lipid Profile: No results for input(s):  CHOL, HDL, LDLCALC, TRIG, CHOLHDL, LDLDIRECT in the last 72 hours. Thyroid Function Tests: No results for input(s): TSH, T4TOTAL, FREET4, T3FREE, THYROIDAB in the last 72 hours. Anemia Panel: No results for input(s): VITAMINB12, FOLATE, FERRITIN, TIBC, IRON, RETICCTPCT in the last 72 hours. Urine analysis:    Component Value Date/Time   COLORURINE YELLOW 01/28/2016 1504   APPEARANCEUR CLEAR 01/28/2016 1504   LABSPEC 1.008 01/28/2016 1504   PHURINE 8.0 01/28/2016 1504   GLUCOSEU NEGATIVE 01/28/2016 1504   HGBUR NEGATIVE 01/28/2016 1504   BILIRUBINUR NEGATIVE 01/28/2016 1504   KETONESUR NEGATIVE 01/28/2016 1504   PROTEINUR NEGATIVE 01/28/2016 1504   NITRITE NEGATIVE 01/28/2016 1504   LEUKOCYTESUR NEGATIVE 01/28/2016 1504   Sepsis Labs: '@LABRCNTIP'$ (procalcitonin:4,lacticidven:4)  ) No results found for this or any previous visit (from the past 240 hour(s)).    Radiology Studies: No results found.   Scheduled Meds: . aspirin EC  81 mg Oral Daily  . atorvastatin  40 mg Oral q1800  . docusate sodium  100 mg Oral BID  . levothyroxine  175 mcg Oral QAC breakfast  . mometasone-formoterol  2 puff Inhalation BID  . pantoprazole  40 mg Oral Q1200  . polyethylene glycol  17 g Oral Daily  . rivaroxaban  15 mg Oral BID WC   Followed by  . [START ON 02/19/2016] rivaroxaban  20 mg Oral Q supper  . sodium chloride flush  10-40 mL Intracatheter Q12H  . torsemide  50 mg Oral BID   Continuous Infusions:     LOS: 11 days   Time Spent in minutes   15 minutes  Brenlyn Beshara D.O. on 01/30/2016 at 3:14 PM  Between 7am to 7pm - Pager - [phone]  After 7pm go to [email or web address] - password [email or web address]  And look for the night coverage person covering for me after hours  [facility] Office  [phone]

## 2016-01-31 DIAGNOSIS — I5032 Chronic diastolic (congestive) heart failure: Secondary | ICD-10-CM

## 2016-01-31 DIAGNOSIS — I9589 Other hypotension: Secondary | ICD-10-CM

## 2016-01-31 LAB — CBC
HEMATOCRIT: 42.7 % (ref 36.0–46.0)
Hemoglobin: 13.6 g/dL (ref 12.0–15.0)
MCH: 28.1 pg (ref 26.0–34.0)
MCHC: 31.9 g/dL (ref 30.0–36.0)
MCV: 88.2 fL (ref 78.0–100.0)
Platelets: 259 10*3/uL (ref 150–400)
RBC: 4.84 MIL/uL (ref 3.87–5.11)
RDW: 15.7 % — AB (ref 11.5–15.5)
WBC: 11.6 10*3/uL — AB (ref 4.0–10.5)

## 2016-01-31 LAB — BASIC METABOLIC PANEL
ANION GAP: 12 (ref 5–15)
BUN: 20 mg/dL (ref 6–20)
CO2: 29 mmol/L (ref 22–32)
Calcium: 9.7 mg/dL (ref 8.9–10.3)
Chloride: 92 mmol/L — ABNORMAL LOW (ref 101–111)
Creatinine, Ser: 1.73 mg/dL — ABNORMAL HIGH (ref 0.44–1.00)
GFR calc Af Amer: 39 mL/min — ABNORMAL LOW (ref >60)
GFR calc non Af Amer: 34 mL/min — ABNORMAL LOW (ref >60)
GLUCOSE: 154 mg/dL — AB (ref 65–99)
POTASSIUM: 4.4 mmol/L (ref 3.5–5.1)
Sodium: 133 mmol/L — ABNORMAL LOW (ref 135–145)

## 2016-01-31 MED ORDER — ASPIRIN 81 MG PO TBEC: 81.0000 mg | DELAYED_RELEASE_TABLET | Freq: Every day | ORAL | Status: AC

## 2016-01-31 MED ORDER — RIVAROXABAN (XARELTO) VTE STARTER PACK (15 & 20 MG): ORAL_TABLET | ORAL | Status: DC

## 2016-01-31 MED ORDER — DOCUSATE SODIUM 100 MG PO CAPS: 100.0000 mg | ORAL_CAPSULE | Freq: Two times a day (BID) | ORAL | Status: DC

## 2016-01-31 NOTE — H&P (Deleted)
Physician Discharge Summary  Bonnie Krause VEL:[id number] DOB: 1966/04/01 DOA: 01/19/2016  PCP: Ronita Hipps, MD  Admit date: 01/19/2016 Discharge date: 01/31/2016  Time spent: 35 minutes  Recommendations for Outpatient Follow-up:  1. Please follow-up on volume status, on discharge spironolactone was discontinued due to issues with dehydration and hyperkalemia. Torsemide 50 mg by mouth twice a day was continued. 2. Follow-up on her kidney function, she was admitted for acute kidney injury likely secondary to volume depletion, responding to IV fluids. On 01/31/2016 creatinine 1.73 with BUN of 20. Spironolactone was discontinued. Will need close outpatient follow-up 3. She was discharged on Xarelto for treatment of right lower extremity DVT 4. She was set up with home health services for physical therapy on discharge.   Discharge Diagnoses:  Principal Problem:   Pulmonary HTN ([place]) Active Problems:   Chest pain   Essential hypertension   HLD (hyperlipidemia)   Hypothyroidism   Yeast infection   Nausea, vomiting, and diarrhea   Abdominal pain   Hypotension   OSA (obstructive sleep apnea)   Shock circulatory (HCC)   Acute renal failure (ARF) (HCC)   Hyperkalemia   Bradycardia   Elevated troponin   EKG abnormalities   NSTEMI (non-ST elevated myocardial infarction) ([place])   Pulmonary hypertension (HCC)   Hypoxemia   OSA on CPAP   Discharge Condition: Stable/improved  Diet recommendation: Heart healthy  Filed Weights   01/29/16 0417 01/30/16 0519 01/31/16 0503  Weight: 119.795 kg (264 lb 1.6 oz) 116.574 kg (257 lb) 114.216 kg (251 lb 12.8 oz)    History of present illness:  Bonnie Krause is a 50 y.o. female with medical history significant of hypertension, hyperlipidemia, hypothyroidism, congestive heart failure, chronic venous insufficiency, possible CKD (last Cre was reportedly 2.0 on 12/2015), currently being treated for yeast infection, who presents with  nausea, vomiting, diarrhea, abdominal pain or chest pain.  Patient was transferred from [facility].  Pt reports that she has been having nausea, vomiting, diarrhea, abdominal pain for almost 2 weeks. She vomited 2-8 times each day, but not today. She has 4 to 5 watery bowel movement each day. Her abdominal pain is located into the upper abdomen, constant, 9 out of 10 in severity, nonradiating. It is not aggravated or alleviated with any normal factors. Patient has subjective fever and chills. Patient also reports chest pain. It is located in both sides of chest, constant, pressure-like, nonradiating. It is not pleuritic. It is not aggravated by deep breath. No tenderness in calf areas. Patient does not have vaginal burning, but symptoms of UTI, unilateral weakness, vision change or hearing loss. At arrival to floor, pt is hypotensive with blood pressure 64/40-75/43, mental status normal.   Pt was initially evaluated in [facility] and was found to have lipase 532, negative troponin, likely due to 0.8, negative urinalysis, WBC 8.1, temperature normal, no tachycardia, potassium 5.6 without EKG change, creatinine is up from 2 to 9.50. Chest x-ray showed borderline cardiomegaly, vascular congestion, mild elevation of the right hemidiaphragm. CT-abd/pelvis showed extensive diverticulosis of sigmoid without diverticulitis. Suspect colitis of infectious or inflammatory nature of ascending colon and cecum. Lymph nodes likely reactive. 9m calculus within porta hepatis, suspicious for small stone within gallbladder neck or cystic duct. No evidence of cholecystitis.  Hospital Course:  Bonnie WJezewskiis a 50year old female with a past medical history of hypertension, hypothyroidism, diastolic congestive heart failure who had been on spironolactone and torsemide in the outpatient setting, [facility] with complaints of nausea, vomiting,  diarrhea, generalized weakness and malaise. She was found to be hypotensive with systolic blood pressures in the 70s and lab work revealed a serum creatinine of 9.5. She was transferred to the intensive care unit at this facility and care for initially by pulmonary critical care medicine. Diuretics were discontinued as she was resuscitated with IV fluids. She responded to IV fluids with improvement to blood pressures in resolution to acute kidney injury. She had been restarted on torsemide and spironolactone. Follow lab work however, showed a potassium of 6.1 for which spironolactone was discontinued. This hospitalization was complicated by development of right lower extremity DVT based on venous Dopplers performed on 01/22/2016. She was started on Xarelto. Overall she showed gradual clinical improvement and by 01/31/2016 was ambulating down the hallway, tolerating by mouth intake, felt well enough to go home. Please follow-up on her volume status as spironolactone was discontinued on discharge area she will also need close follow-up with her renal function. She was instructed to follow-up with her primary care physician in 1 week.  Hypotension secondary to hypovolemia -Blood pressures low-normal -Spironolactone was stopped on 01/29/2026   Hyperkalemia -Lab work showing potassium of 6.1, suspect secondary to spironolactone for which this medication was discontinued -She was given amp of D50 with IV insulin -Repeat labs on 722 showing resolution to hyperkalemia with potassium of 4.4  Chest pain -Cardiology to consulted and appreciated -Status post cardiac catheterization, no CAD -Lisinopril held -Continue aspirin, statin  Pulmonary hypertension -Continue torsemide -Monitor intake and output, daily weights -serologies unremarkable (ESR, CRP, ANA, ANCA, Anti-Scl-70, Anti-sm, RF, CK) -Patient will need outpatient follow-up   Right lower extremity DVT -She was discharged on Xarelto  Acute kidney  injury/metabolic acidosis -She has had intermittent improvement to her renal function, with a.m. lab work on 01/28/2016 showing creatinine 1.18 with BUN of 12. -Suspect that acute renal failure likely precipitated by dehydration/hypovolemia possibly from ATN in setting of hypotension. Patient responded to IV fluids -On 01/31/2016 lab work showing creatinine of 1.73 with BUN of 20  Nausea -Resolved, continue antiemetics as needed  Obstructive sleep apnea -Continue CPAP QHS  Colitis -Resolved, currently off of antibiotics  Hypothyroidism -Continue Synthroid, TSH 2.2  Bradycardia  Hyperlipidemia -Continue statin  Procedures:  Transthoracic echocardiogram performed on 01/21/2016 - Left ventricle: The cavity size was normal. Systolic function was  vigorous. The estimated ejection fraction was in the range of 65%  to 70%. Wall motion was normal; there were no regional wall  motion abnormalities. Features are consistent with a pseudonormal  left ventricular filling pattern, with concomitant abnormal  relaxation and increased filling pressure (grade 2 diastolic  dysfunction). Doppler parameters are consistent with elevated  ventricular end-diastolic filling pressure.  Consultations: PCCM  Cardiology   Discharge Exam: Filed Vitals:   01/31/16 0000 01/31/16 0641  BP:  101/69  Pulse: 79 93  Temp:  97.9 F (36.6 C)  Resp: 18 16     General: Well developed, well nourished, NAD, appears stated age  HEENT: NCAT, mucous membranes moist.   Neck: Supple, no JVD, no masses  Cardiovascular: S1 S2 auscultated, RRR, no murmur  Respiratory: Clear to auscultation bilaterally with equal chest rise, no wheezing  Abdomen: Soft, nontender, nondistended, + bowel sounds  Extremities: warm dry without cyanosis clubbing. +1 edema LLE > RLE  Neuro: AAOx3, nonfocal  Skin: Without rashes exudates or nodules  Psych: Normal affect and demeanor with intact judgement and  insight  Discharge Instructions   Discharge Instructions    Call MD for:  difficulty breathing, headache or visual disturbances    Complete by:  As directed      Call MD for:  extreme fatigue    Complete by:  As directed      Call MD for:  hives    Complete by:  As directed      Call MD for:  persistant dizziness or light-headedness    Complete by:  As directed      Call MD for:  persistant nausea and vomiting    Complete by:  As directed      Call MD for:  redness, tenderness, or signs of infection (pain, swelling, redness, odor or green/yellow discharge around incision site)    Complete by:  As directed      Call MD for:  severe uncontrolled pain    Complete by:  As directed      Call MD for:  temperature >100.4    Complete by:  As directed      Call MD for:    Complete by:  As directed      Diet - low sodium heart healthy    Complete by:  As directed      Increase activity slowly    Complete by:  As directed           Current Discharge Medication List    START taking these medications   Details  aspirin EC 81 MG EC tablet Take 1 tablet (81 mg total) by mouth daily. Qty: 30 tablet, Refills: 0    docusate sodium (COLACE) 100 MG capsule Take 1 capsule (100 mg total) by mouth 2 (two) times daily. Qty: 10 capsule, Refills: 0    Rivaroxaban 15 & 20 MG TBPK Take as directed on package: Start with one '15mg'$  tablet by mouth twice a day with food. On Day 22 which will be on Aug 10, switch to one '20mg'$  tablet once a day with food. Qty Sufficient Qty: 51 each, Refills: 3      CONTINUE these medications which have NOT CHANGED   Details  atorvastatin (LIPITOR) 40 MG tablet Take 40 mg by mouth every evening.    cholecalciferol (VITAMIN D) 1000 units tablet Take 2,000 Units by mouth daily.    Coenzyme Q10 (COQ10 PO) Take 1 capsule by mouth daily.    levothyroxine (SYNTHROID, LEVOTHROID) 175 MCG tablet Take 175 mcg by mouth daily before breakfast.    Omega-3 Krill Oil 300 MG  CAPS Take 1 capsule by mouth daily.    omeprazole (PRILOSEC) 20 MG capsule Take 20 mg by mouth 2 (two) times daily.    ondansetron (ZOFRAN) 4 MG tablet Take 4 mg by mouth every 8 (eight) hours as needed for nausea or vomiting.  Refills: 0    torsemide (DEMADEX) 100 MG tablet Take 50 mg by mouth 2 (two) times daily.      STOP taking these medications     aspirin (GOODSENSE ASPIRIN) 325 MG tablet      lisinopril (PRINIVIL,ZESTRIL) 2.5 MG tablet      spironolactone (ALDACTONE) 25 MG tablet        Allergies  Allergen Reactions  . Penicillins Anaphylaxis and Hives   Follow-up Information    Follow up with HOLT,LYNLEY S, MD In 1 week.   Specialty:  Family Medicine   Contact information:   [place] [phone]        The  results of significant diagnostics from this hospitalization (including imaging, microbiology, ancillary and laboratory) are listed below for reference.    Significant Diagnostic Studies: Ct Angio Chest Pe W Or Wo Contrast  01/24/2016  CLINICAL DATA:  Evaluate for pulmonary embolus. Pulmonary hypertension. EXAM: CT ANGIOGRAPHY CHEST WITH CONTRAST TECHNIQUE: Multidetector CT imaging of the chest was performed using the standard protocol during bolus administration of intravenous contrast. Multiplanar CT image reconstructions and MIPs were obtained to evaluate the vascular anatomy. CONTRAST:  130 cc of Isovue 350 COMPARISON:  None. FINDINGS: Mediastinum/Lymph Nodes: During the first contrast injection there was insufficient pulmonary arterial opacification for diagnostic assessment of acute pulmonary embolus. A second injection was performed which extravasated into the patient's arm. The main pulmonary artery measures 3.8 cm in diameter, image 49 of series 4. The heart size is mildly enlarged. Calcification within the mitral valve is identified. There is mild aortic atherosclerosis. No enlarged mediastinal or hilar lymph nodes identified.  There is no pericardial effusion. Lungs/Pleura: There is a small to moderate right pleural effusion. Small left pleural effusion is also identified. A mild mosaic attenuation pattern is identified within the lungs. No airspace consolidation or atelectasis identified. No suspicious pulmonary nodule or mass identified Upper abdomen: No acute findings identified within the upper abdomen. Musculoskeletal: There is degenerative disc disease identified within the thoracic spine. No aggressive lytic or sclerotic bone lesions. Review of the MIP images confirms the above findings. IMPRESSION: 1. Examination is nondiagnostic for the presence or absence of pulmonary embolus. 2. There is increase caliber of the main pulmonary artery which may be a manifestation of pulmonary hypertension. 3. Aortic atherosclerosis and LAD coronary artery calcification 4. Small pleural effusions right greater in left 5. Mosaic attenuation pattern within the lungs compatible with either small airways disease or chronic pulmonary emboli. Electronically Signed   By: Kerby Moors M.D.   On: 01/24/2016 11:26   US Renal  01/20/2016  CLINICAL DATA:  Acute renal failure; chronic CHF, circulatory shock, dehydration, 3 weeks of nausea vomiting and diarrhea. EXAM: RENAL / URINARY TRACT ULTRASOUND COMPLETE COMPARISON:  Abdominal CT scan of January 19, 2016 FINDINGS: Right Kidney: Length: 11.3 cm. Echogenicity is slightly increased as compared to the adjacent liver. No mass or hydronephrosis visualized. No stones observed. Left Kidney: Length: 12.1 cm. Echogenicity similar to that on the right. No mass or hydronephrosis. No stones observed. Bladder: The urinary bladder is decompressed with a Foley catheter. IMPRESSION: No evidence of obstruction. The renal cortical echotexture is mildly increased as compared to the liver which could reflect medical renal disease. Electronically Signed   By: David  Martinique M.D.   On: 01/20/2016 07:46   Nm Pulmonary Perf And  Vent  01/22/2016  CLINICAL DATA:  Severe pulmonary hypertension. Congestive heart failure. EXAM: NUCLEAR MEDICINE VENTILATION - PERFUSION LUNG SCAN TECHNIQUE: Ventilation images were obtained in multiple projections using inhaled aerosol Tc-37mDTPA. Perfusion images were obtained in multiple projections after intravenous injection of Tc-960mAA. RADIOPHARMACEUTICALS:  31.5 mCi Technetium-9931mPA aerosol inhalation and 4.2 mCi Technetium-60m34m IV COMPARISON:  3 chest radiograph 01/22/2016 compared with the FINDINGS: Ventilation: Mild heterogeneity of ventilation. Decreased ventilation to the RIGHT lower lobe corresponds to elevated hemidiaphragm. Perfusion: No wedge-shaped peripheral perfusion defects. Decreased ventilation to the RIGHT lower lobe corresponds to the elevated RIGHT hemidiaphragm. IMPRESSION: No evidence of pulmonary embolism. Electronically Signed   By: StewSuzy Bouchard.   On: 01/22/2016 11:00   Dg Chest Port 1 View  01/22/2016  CLINICAL  DATA:  Respiratory failure EXAM: PORTABLE CHEST 1 VIEW COMPARISON:  01/21/2016 FINDINGS: Cardiac shadow is mildly enlarged but stable. The lungs are well aerated bilaterally. Mild eventration of right hemidiaphragm is noted. No focal infiltrate is seen. No bony abnormality is noted. IMPRESSION: No active disease. Electronically Signed   By: Inez Catalina M.D.   On: 01/22/2016 07:23   Dg Chest Port 1 View  01/21/2016  CLINICAL DATA:  Respiratory failure EXAM: PORTABLE CHEST 1 VIEW COMPARISON:  January 20, 2016 FINDINGS: There is stable mild elevation of the right hemidiaphragm. There is no edema or consolidation. There is cardiomegaly. There is again noted a degree of pulmonary venous hypertension. No adenopathy evident. No bone lesions. IMPRESSION: Stable pulmonary vascular congestion. No frank edema or consolidation. Electronically Signed   By: Lowella Grip III M.D.   On: 01/21/2016 07:17   Dg Chest Port 1 View  01/20/2016  CLINICAL DATA:  Acute  respiratory failure, shortness of breath, chronic CHF, acute renal failure. EXAM: PORTABLE CHEST 1 VIEW COMPARISON:  Portable chest x-ray of January 19, 2016 FINDINGS: There is chronic elevation of the right hemidiaphragm. The lungs are clear. There is mild stable prominence of the central pulmonary vascularity. There is stable cardiomegaly. There is no pulmonary edema. No significant pleural effusion is observed. IMPRESSION: Stable appearance of the chest since yesterday's study. Central pulmonary vascular prominence and mild cardiomegaly without pulmonary interstitial or alveolar edema. Electronically Signed   By: David  Martinique M.D.   On: 01/20/2016 07:50    Microbiology: No results found for this or any previous visit (from the past 240 hour(s)).   Labs: Basic Metabolic Panel:  Recent Labs Lab 01/26/16 0455 01/27/16 0445 01/28/16 0535 01/30/16 0415 01/31/16 0609  NA 137 137 137 133* 133*  K 4.5 4.0 3.8 6.1* 4.4  CL 100* 95* 94* 93* 92*  CO2 33* 35* 35* 31 29  GLUCOSE 79 89 87 85 154*  BUN '14 12 12 17 20  '$ CREATININE 0.85 1.03* 1.18* 1.34* 1.73*  CALCIUM 8.4* 8.8* 9.0 9.1 9.7   Liver Function Tests: No results for input(s): AST, ALT, ALKPHOS, BILITOT, PROT, ALBUMIN in the last 168 hours. No results for input(s): LIPASE, AMYLASE in the last 168 hours. No results for input(s): AMMONIA in the last 168 hours. CBC:  Recent Labs Lab 01/27/16 0445 01/28/16 0535 01/29/16 0430 01/30/16 0415 01/31/16 0430  WBC 11.2* 8.4 8.1 8.2 11.6*  HGB 10.7* 10.7* 11.2* 11.9* 13.6  HCT 35.6* 34.4* 34.9* 37.6 42.7  MCV 92.0 90.1 88.4 88.5 88.2  PLT 139* 135* 189 211 259   Cardiac Enzymes: No results for input(s): CKTOTAL, CKMB, CKMBINDEX, TROPONINI in the last 168 hours. BNP: BNP (last 3 results)  Recent Labs  01/19/16 2253  BNP 467.4*    ProBNP (last 3 results) No results for input(s): PROBNP in the last 8760 hours.  CBG: No results for input(s): GLUCAP in the last 168  hours.     Signed:  Kelvin Cellar MD.  [facility] 01/31/2016, 10:44 AM

## 2016-01-31 NOTE — Progress Notes (Signed)
CM received call from RN requesting Rolling walker.  Cm called [facility] DME rep, Jermaine to please deliver rolling walker to room so pt can be discharged. No other CM needs were communicated.

## 2016-01-31 NOTE — Progress Notes (Signed)
Discharged to home with family office visits in place teaching done Discharged to home with family office visits in place teaching done  

## 2016-02-01 NOTE — Discharge Summary (Signed)
Bonnie Krause RSW:[id number] DOB: Dec 02, 1965 DOA: 01/19/2016  PCP: Ronita Hipps, MD  Admit date: 01/19/2016 Discharge date: 01/31/2016  Time spent: 35 minutes  Recommendations for Outpatient Follow-up:  1. Please follow-up on volume status, on discharge spironolactone was discontinued due to issues with dehydration and hyperkalemia. Torsemide 50 mg by mouth twice a day was continued. 2. Follow-up on her kidney function, she was admitted for acute kidney injury likely secondary to volume depletion, responding to IV fluids. On 01/31/2016 creatinine 1.73 with BUN of 20. Spironolactone was discontinued. Will need close outpatient follow-up 3. She was discharged on Xarelto for treatment of right lower extremity DVT 4. She was set up with home health services for physical therapy on discharge.   Discharge Diagnoses:  Principal Problem:   Pulmonary HTN ([place]) Active Problems:   Chest pain   Essential hypertension   HLD (hyperlipidemia)   Hypothyroidism   Yeast infection   Nausea, vomiting, and diarrhea   Abdominal pain   Hypotension   OSA (obstructive sleep apnea)   Shock circulatory (HCC)   Acute renal failure (ARF) (HCC)   Hyperkalemia   Bradycardia   Elevated troponin   EKG abnormalities   NSTEMI (non-ST elevated myocardial infarction) ([place])   Pulmonary hypertension (HCC)   Hypoxemia   OSA on CPAP   Discharge Condition: Stable/improved  Diet recommendation: Heart healthy       Filed Weights   01/29/16 0417 01/30/16 0519 01/31/16 0503  Weight: 119.795 kg (264 lb 1.6 oz) 116.574 kg (257 lb) 114.216 kg (251 lb 12.8 oz)    History of present illness:  Bonnie Krause is a 50 y.o. female with medical history significant of hypertension, hyperlipidemia, hypothyroidism, congestive heart failure, chronic venous insufficiency, possible CKD (last Cre was reportedly 2.0 on 12/2015), currently being treated for yeast infection, who presents with nausea, vomiting,  diarrhea, abdominal pain or chest pain.  Patient was transferred from [facility].  Pt reports that she has been having nausea, vomiting, diarrhea, abdominal pain for almost 2 weeks. She vomited 2-8 times each day, but not today. She has 4 to 5 watery bowel movement each day. Her abdominal pain is located into the upper abdomen, constant, 9 out of 10 in severity, nonradiating. It is not aggravated or alleviated with any normal factors. Patient has subjective fever and chills. Patient also reports chest pain. It is located in both sides of chest, constant, pressure-like, nonradiating. It is not pleuritic. It is not aggravated by deep breath. No tenderness in calf areas. Patient does not have vaginal burning, but symptoms of UTI, unilateral weakness, vision change or hearing loss. At arrival to floor, pt is hypotensive with blood pressure 64/40-75/43, mental status normal.   Pt was initially evaluated in [facility] and was found to have lipase 532, negative troponin, likely due to 0.8, negative urinalysis, WBC 8.1, temperature normal, no tachycardia, potassium 5.6 without EKG change, creatinine is up from 2 to 9.50. Chest x-ray showed borderline cardiomegaly, vascular congestion, mild elevation of the right hemidiaphragm. CT-abd/pelvis showed extensive diverticulosis of sigmoid without diverticulitis. Suspect colitis of infectious or inflammatory nature of ascending colon and cecum. Lymph nodes likely reactive. 75m calculus within porta hepatis, suspicious for small stone within gallbladder neck or cystic duct. No evidence of cholecystitis.  Hospital Course:  Mrs WKimbleris a 50year old female with a past medical history of hypertension, hypothyroidism, diastolic congestive heart failure who had been on spironolactone and torsemide in the outpatient setting, [facility] where she initially presented with complaints of nausea, vomiting, diarrhea,  generalized weakness and malaise. She was found to be hypotensive with systolic blood pressures in the 70s and lab work revealed a serum creatinine of 9.5. She was transferred to the intensive care unit at this facility and care for initially by pulmonary critical care medicine. Diuretics were discontinued as she was resuscitated with IV fluids. She responded to IV fluids with improvement to blood pressures in resolution to acute kidney injury. She had been restarted on torsemide and spironolactone. Follow lab work however, showed a potassium of 6.1 for which spironolactone was discontinued. This hospitalization was complicated by development of right lower extremity DVT based on venous Dopplers performed on 01/22/2016. She was started on Xarelto. Overall she showed gradual clinical improvement and by 01/31/2016 was ambulating down the hallway, tolerating by mouth intake, felt well enough to go home. Please follow-up on her volume status as spironolactone was discontinued on discharge area she will also need close follow-up with her renal function. She was instructed to follow-up with her primary care physician in 1 week.  Hypotension secondary to hypovolemia -Blood pressures low-normal -Spironolactone was stopped on 01/29/2026   Hyperkalemia -Lab work showing potassium of 6.1, suspect secondary to spironolactone for which this medication was discontinued -She was given amp of D50 with IV insulin -Repeat labs on 722 showing resolution to hyperkalemia with potassium of 4.4  Chest pain -Cardiology to consulted and appreciated -Status post cardiac catheterization, no CAD -Lisinopril held -Continue aspirin, statin  Pulmonary hypertension -Continue torsemide -Monitor intake and output, daily weights -serologies unremarkable (ESR, CRP, ANA, ANCA, Anti-Scl-70, Anti-sm, RF, CK) -Patient will need outpatient follow-up   Right lower extremity DVT -She was discharged on Xarelto  Acute kidney  injury/metabolic acidosis -She has had intermittent improvement to her renal function, with a.m. lab work on 01/28/2016 showing creatinine 1.18 with BUN of 12. -Suspect that acute renal failure likely precipitated by dehydration/hypovolemia possibly from ATN in setting of hypotension. Patient responded to IV fluids -On 01/31/2016 lab work showing creatinine of 1.73 with BUN of 20  Nausea -Resolved, continue antiemetics as needed  Obstructive sleep apnea -Continue CPAP QHS  Colitis -Resolved, currently off of antibiotics  Hypothyroidism -Continue Synthroid, TSH 2.2  Bradycardia  Hyperlipidemia -Continue statin  Procedures:  Transthoracic echocardiogram performed on 01/21/2016 - Left ventricle: The cavity size was normal. Systolic function was  vigorous. The estimated ejection fraction was in the range of 65%  to 70%. Wall motion was normal; there were no regional wall  motion abnormalities. Features are consistent with a pseudonormal  left ventricular filling pattern, with concomitant abnormal  relaxation and increased filling pressure (grade 2 diastolic  dysfunction). Doppler parameters are consistent with elevated  ventricular end-diastolic filling pressure.  Consultations: PCCM  Cardiology   Discharge Exam:     Filed Vitals:   01/31/16 0000 01/31/16 0641  BP:  101/69  Pulse: 79 93  Temp:  97.9 F (36.6 C)  Resp: 18 16     General: Well developed, well nourished, NAD, appears stated age  HEENT: NCAT, mucous membranes moist.   Neck: Supple, no JVD, no masses  Cardiovascular: S1 S2 auscultated, RRR, no murmur  Respiratory: Clear to auscultation bilaterally with equal chest rise, no wheezing  Abdomen: Soft, nontender, nondistended, + bowel sounds  Extremities: warm dry without cyanosis clubbing. +1 edema LLE > RLE  Neuro: AAOx3, nonfocal  Skin: Without rashes exudates or nodules  Psych: Normal affect and demeanor with intact  judgement and insight  Discharge Instructions       Discharge Instructions    Call MD for:  difficulty breathing, headache or visual disturbances    Complete by:  As directed      Call MD for:  extreme fatigue    Complete by:  As directed      Call MD for:  hives    Complete by:  As directed      Call MD for:  persistant dizziness or light-headedness    Complete by:  As directed      Call MD for:  persistant nausea and vomiting    Complete by:  As directed      Call MD for:  redness, tenderness, or signs of infection (pain, swelling, redness, odor or green/yellow discharge around incision site)    Complete by:  As directed      Call MD for:  severe uncontrolled pain    Complete by:  As directed      Call MD for:  temperature >100.4    Complete by:  As directed      Call MD for:    Complete by:  As directed      Diet - low sodium heart healthy    Complete by:  As directed      Increase activity slowly    Complete by:  As directed               Current Discharge Medication List    START taking these medications   Details  aspirin EC 81 MG EC tablet Take 1 tablet (81 mg total) by mouth daily. Qty: 30 tablet, Refills: 0    docusate sodium (COLACE) 100 MG capsule Take 1 capsule (100 mg total) by mouth 2 (two) times daily. Qty: 10 capsule, Refills: 0    Rivaroxaban 15 & 20 MG TBPK Take as directed on package: Start with one 63m tablet by mouth twice a day with food. On Day 22 which will be on Aug 10, switch to one 276mtablet once a day with food. Qty Sufficient Qty: 51 each, Refills: 3      CONTINUE these medications which have NOT CHANGED   Details  atorvastatin (LIPITOR) 40 MG tablet Take 40 mg by mouth every evening.    cholecalciferol (VITAMIN D) 1000 units tablet Take 2,000 Units by mouth daily.    Coenzyme Q10 (COQ10 PO) Take 1 capsule by mouth daily.    levothyroxine (SYNTHROID, LEVOTHROID) 175 MCG tablet Take  175 mcg by mouth daily before breakfast.    Omega-3 Krill Oil 300 MG CAPS Take 1 capsule by mouth daily.    omeprazole (PRILOSEC) 20 MG capsule Take 20 mg by mouth 2 (two) times daily.    ondansetron (ZOFRAN) 4 MG tablet Take 4 mg by mouth every 8 (eight) hours as needed for nausea or vomiting.  Refills: 0    torsemide (DEMADEX) 100 MG tablet Take 50 mg by mouth 2 (two) times daily.      STOP taking these medications     aspirin (GOODSENSE ASPIRIN) 325 MG tablet      lisinopril (PRINIVIL,ZESTRIL) 2.5 MG tablet      spironolactone (ALDACTONE) 25 MG tablet            Allergies  Allergen Reactions  . Penicillins Anaphylaxis and Hives      Follow-up Information    Follow up with HOLT,LYNLEY S, MD In 1 week.   Specialty:  Family Medicine  Contact information:   [address] [place] [address] [phone]        The results of significant diagnostics from this hospitalization (including imaging, microbiology, ancillary and laboratory) are listed below for reference.    Significant Diagnostic Studies:  Imaging Results  Ct Angio Chest Pe W Or Wo Contrast  01/24/2016  CLINICAL DATA:  Evaluate for pulmonary embolus. Pulmonary hypertension. EXAM: CT ANGIOGRAPHY CHEST WITH CONTRAST TECHNIQUE: Multidetector CT imaging of the chest was performed using the standard protocol during bolus administration of intravenous contrast. Multiplanar CT image reconstructions and MIPs were obtained to evaluate the vascular anatomy. CONTRAST:  130 cc of Isovue 350 COMPARISON:  None. FINDINGS: Mediastinum/Lymph Nodes: During the first contrast injection there was insufficient pulmonary arterial opacification for diagnostic assessment of acute pulmonary embolus. A second injection was performed which extravasated into the patient's arm. The main pulmonary artery measures 3.8 cm in diameter, image 49 of series 4. The heart size is mildly enlarged. Calcification  within the mitral valve is identified. There is mild aortic atherosclerosis. No enlarged mediastinal or hilar lymph nodes identified. There is no pericardial effusion. Lungs/Pleura: There is a small to moderate right pleural effusion. Small left pleural effusion is also identified. A mild mosaic attenuation pattern is identified within the lungs. No airspace consolidation or atelectasis identified. No suspicious pulmonary nodule or mass identified Upper abdomen: No acute findings identified within the upper abdomen. Musculoskeletal: There is degenerative disc disease identified within the thoracic spine. No aggressive lytic or sclerotic bone lesions. Review of the MIP images confirms the above findings. IMPRESSION: 1. Examination is nondiagnostic for the presence or absence of pulmonary embolus. 2. There is increase caliber of the main pulmonary artery which may be a manifestation of pulmonary hypertension. 3. Aortic atherosclerosis and LAD coronary artery calcification 4. Small pleural effusions right greater in left 5. Mosaic attenuation pattern within the lungs compatible with either small airways disease or chronic pulmonary emboli. Electronically Signed   By: Kerby Moors M.D.   On: 01/24/2016 11:26   US Renal  01/20/2016  CLINICAL DATA:  Acute renal failure; chronic CHF, circulatory shock, dehydration, 3 weeks of nausea vomiting and diarrhea. EXAM: RENAL / URINARY TRACT ULTRASOUND COMPLETE COMPARISON:  Abdominal CT scan of January 19, 2016 FINDINGS: Right Kidney: Length: 11.3 cm. Echogenicity is slightly increased as compared to the adjacent liver. No mass or hydronephrosis visualized. No stones observed. Left Kidney: Length: 12.1 cm. Echogenicity similar to that on the right. No mass or hydronephrosis. No stones observed. Bladder: The urinary bladder is decompressed with a Foley catheter. IMPRESSION: No evidence of obstruction. The renal cortical echotexture is mildly increased as compared to the liver  which could reflect medical renal disease. Electronically Signed   By: David  Martinique M.D.   On: 01/20/2016 07:46   Nm Pulmonary Perf And Vent  01/22/2016  CLINICAL DATA:  Severe pulmonary hypertension. Congestive heart failure. EXAM: NUCLEAR MEDICINE VENTILATION - PERFUSION LUNG SCAN TECHNIQUE: Ventilation images were obtained in multiple projections using inhaled aerosol Tc-76mDTPA. Perfusion images were obtained in multiple projections after intravenous injection of Tc-951mAA. RADIOPHARMACEUTICALS:  31.5 mCi Technetium-995mPA aerosol inhalation and 4.2 mCi Technetium-29m59m IV COMPARISON:  3 chest radiograph 01/22/2016 compared with the FINDINGS: Ventilation: Mild heterogeneity of ventilation. Decreased ventilation to the RIGHT lower lobe corresponds to elevated hemidiaphragm. Perfusion: No wedge-shaped peripheral perfusion defects. Decreased ventilation to the RIGHT lower lobe corresponds to the elevated RIGHT hemidiaphragm. IMPRESSION: No evidence of pulmonary embolism. Electronically Signed  By: Suzy Bouchard M.D.   On: 01/22/2016 11:00   Dg Chest Port 1 View  01/22/2016  CLINICAL DATA:  Respiratory failure EXAM: PORTABLE CHEST 1 VIEW COMPARISON:  01/21/2016 FINDINGS: Cardiac shadow is mildly enlarged but stable. The lungs are well aerated bilaterally. Mild eventration of right hemidiaphragm is noted. No focal infiltrate is seen. No bony abnormality is noted. IMPRESSION: No active disease. Electronically Signed   By: Inez Catalina M.D.   On: 01/22/2016 07:23   Dg Chest Port 1 View  01/21/2016  CLINICAL DATA:  Respiratory failure EXAM: PORTABLE CHEST 1 VIEW COMPARISON:  January 20, 2016 FINDINGS: There is stable mild elevation of the right hemidiaphragm. There is no edema or consolidation. There is cardiomegaly. There is again noted a degree of pulmonary venous hypertension. No adenopathy evident. No bone lesions. IMPRESSION: Stable pulmonary vascular congestion. No frank edema or  consolidation. Electronically Signed   By: Lowella Grip III M.D.   On: 01/21/2016 07:17   Dg Chest Port 1 View  01/20/2016  CLINICAL DATA:  Acute respiratory failure, shortness of breath, chronic CHF, acute renal failure. EXAM: PORTABLE CHEST 1 VIEW COMPARISON:  Portable chest x-ray of January 19, 2016 FINDINGS: There is chronic elevation of the right hemidiaphragm. The lungs are clear. There is mild stable prominence of the central pulmonary vascularity. There is stable cardiomegaly. There is no pulmonary edema. No significant pleural effusion is observed. IMPRESSION: Stable appearance of the chest since yesterday's study. Central pulmonary vascular prominence and mild cardiomegaly without pulmonary interstitial or alveolar edema. Electronically Signed   By: David  Martinique M.D.   On: 01/20/2016 07:50     Microbiology: No results found for this or any previous visit (from the past 240 hour(s)).   Labs: Basic Metabolic Panel:  Last Labs    Recent Labs Lab 01/26/16 0455 01/27/16 0445 01/28/16 0535 01/30/16 0415 01/31/16 0609  NA 137 137 137 133* 133*  K 4.5 4.0 3.8 6.1* 4.4  CL 100* 95* 94* 93* 92*  CO2 33* 35* 35* 31 29  GLUCOSE 79 89 87 85 154*  BUN _0 CREATININE 0.85 1.03* 1.18* 1.34* 1.73*  CALCIUM 8.4* 8.8* 9.0 9.1 9.7     Liver Function Tests: Last Labs   No results for input(s): AST, ALT, ALKPHOS, BILITOT, PROT, ALBUMIN in the last 168 hours.   Last Labs   No results for input(s): LIPASE, AMYLASE in the last 168 hours.   Last Labs   No results for input(s): AMMONIA in the last 168 hours.   CBC:  Last Labs    Recent Labs Lab 01/27/16 0445 01/28/16 0535 01/29/16 0430 01/30/16 0415 01/31/16 0430  WBC 11.2* 8.4 8.1 8.2 11.6*  HGB 10.7* 10.7* 11.2* 11.9* 13.6  HCT 35.6* 34.4* 34.9* 37.6 42.7  MCV 92.0 90.1 88.4 88.5 88.2  PLT 139* 135* 189 211 259     Cardiac Enzymes: Last Labs   No results for input(s): CKTOTAL, CKMB, CKMBINDEX,  TROPONINI in the last 168 hours.   BNP: BNP (last 3 results)  Recent Labs (within last 365 days)   Recent Labs  01/19/16 2253  BNP 467.4*      ProBNP (last 3 results) Recent Labs (within last 365 days)  No results for input(s): PROBNP in the last 8760 hours.    CBG: Last Labs   No results for input(s): GLUCAP in the last 168 hours.       Signed:  Kelvin Cellar MD.  [facility] 01/31/2016, 10:44 AM

## 2016-03-24 ENCOUNTER — Encounter: Payer: Self-pay | Admitting: Neurology

## 2016-03-24 ENCOUNTER — Ambulatory Visit (INDEPENDENT_AMBULATORY_CARE_PROVIDER_SITE_OTHER): Payer: PRIVATE HEALTH INSURANCE | Admitting: Neurology

## 2016-03-24 VITALS — BP 116/82 | HR 72 | Resp 26 | Ht 64.0 in | Wt 250.0 lb

## 2016-03-24 DIAGNOSIS — F8081 Childhood onset fluency disorder: Secondary | ICD-10-CM

## 2016-03-24 DIAGNOSIS — R4189 Other symptoms and signs involving cognitive functions and awareness: Secondary | ICD-10-CM

## 2016-03-24 DIAGNOSIS — R413 Other amnesia: Secondary | ICD-10-CM

## 2016-03-24 DIAGNOSIS — R251 Tremor, unspecified: Secondary | ICD-10-CM

## 2016-03-24 DIAGNOSIS — R258 Other abnormal involuntary movements: Secondary | ICD-10-CM

## 2016-03-24 DIAGNOSIS — R2689 Other abnormalities of gait and mobility: Secondary | ICD-10-CM

## 2016-03-24 DIAGNOSIS — R29818 Other symptoms and signs involving the nervous system: Secondary | ICD-10-CM

## 2016-03-24 NOTE — Patient Instructions (Addendum)
We will do a brain scan, called MRI and call you with the test results. We will have to schedule you for this on a separate date. This test requires authorization from your insurance, and we will take care of the insurance process.  Your exam looks good from the neurological standpoint. If your MRI brain is benign, I will see you back as needed.

## 2016-03-24 NOTE — Progress Notes (Signed)
Subjective:    Patient ID: Bonnie Krause is a 50 y.o. female.  HPI     Bonnie Foley, MD, PhD [facility] [address], [address] [address] [place], [place] [address]  Dear Dr. Leonor Krause,   I saw your patient, Bonnie Krause, upon your kind request in my neurologic clinic today for initial consultation of her right hand tremor more, word finding and memory issues. The patient is unaccompanied today. As you know, Bonnie Krause is a 50 year old right-handed woman with an underlying complex medical history of recent hospitalization in July 2017, pulmonary hypertension, chest pain, hypertension, hypothyroidism, hyperlipidemia, obstructive sleep apnea, acute renal failure, non-STEMI, chronic venous insufficiency, congestive heart failure, and obesity, as well as recent diagnosis of right lower extremity DVT, who reports an intermittent right hand tremor since her recent hospitalization. I reviewed your office note from 03/08/2016, which you kindly included.   I also reviewed her hospital records from 01/19/2016 through 01/31/2016. She was originally transferred from [facility] at the time.  She reports that in the beginning of her hospital stay she had stuttering which resolved after 4 days. She also felt that she was having trouble using her right hand, it was trembling and she had to hold it against her body to keep the trembling at bay. She now has only occasional tremors in her right hand, she also reports word finding difficulty and tip of the tongue phenomenon. She says that she was told she had a mild stroke. I reviewed her hospital records and emergency room records and H&P and discharge summaries and there was no notation of any TIA symptoms or neurology consult and she did not have any CT head or MRI brain at the time. Overall, she feels improved. She walks with a cane for safety. She has had balance problems for quite some time and has had some falls. 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  Her Past Medical History Is Significant For: Past Medical History:  Diagnosis Date  . Acute renal failure (HCC) 01/19/2016  . Asthma   . Chronic bronchitis (HCC)   . Chronic CHF (congestive heart failure) (HCC)   . Chronic venous insufficiency   . CKD (chronic kidney disease)   . DVT (deep venous thrombosis) (HCC)    "behind right knee; @ my left ankle" (01/26/2016)  . Essential hypertension   . Heart attack (HCC)   . Heart murmur   . HLD (hyperlipidemia)   . Hypothyroidism   . Migraine    "for the last few months" (01/26/2016)  . On home oxygen therapy    "2L; 24/7" (01/26/2016)  . OSA on CPAP   . Pneumonia "several times - 2016  . Stroke (HCC)   . Yeast infection     Her Past Surgical History Is Significant For: Past Surgical History:  Procedure Laterality Date  . CARDIAC CATHETERIZATION N/A 01/23/2016   Procedure: Right/Left Heart Cath and Coronary Angiography;  Surgeon: Kathleene Hazel, MD;  Location: [facility] INVASIVE CV LAB;  Service: Cardiovascular;  Laterality: N/A;  . KNEE DISLOCATION SURGERY Right 1980s    Her Family History Is Significant For: Family History  Problem Relation Age of Onset  . Diabetes Mother   . Heart disease Mother   . Hypertension Mother   . Cancer Father     Her Social History  Is Significant For: Social History   Social History  . Marital status: Single    Spouse name: N/A  . Number of children: 0  . Years of education: college   Social History Main Topics  . Smoking status: Never Smoker  . Smokeless tobacco: Never Used  . Alcohol use No  . Drug use: No  . Sexual activity: No   Other Topics Concern  . None   Social History Narrative   Occasionally drinks tea with caffeine.     Her Allergies Are:  Allergies  Allergen Reactions  . Penicillins  Anaphylaxis and Hives  . Dopamine   :   Her Current Medications Are:  Outpatient Encounter Prescriptions as of 03/24/2016  Medication Sig  . aspirin EC 81 MG EC tablet Take 1 tablet (81 mg total) by mouth daily.  Marland Kitchen. atorvastatin (LIPITOR) 40 MG tablet Take 40 mg by mouth every evening.  . cholecalciferol (VITAMIN D) 1000 units tablet Take 2,000 Units by mouth daily.  . Coenzyme Q10 (COQ10 PO) Take 1 capsule by mouth daily.  Marland Kitchen. levothyroxine (SYNTHROID, LEVOTHROID) 175 MCG tablet Take 175 mcg by mouth daily before breakfast.  . Omega-3 Krill Oil 300 MG CAPS Take 1 capsule by mouth daily.  Marland Kitchen. omeprazole (PRILOSEC) 20 MG capsule Take 20 mg by mouth 2 (two) times daily.  . ondansetron (ZOFRAN) 4 MG tablet Take 4 mg by mouth every 8 (eight) hours as needed for nausea or vomiting.   . potassium chloride SA (K-DUR,KLOR-CON) 20 MEQ tablet Take 20 mEq by mouth 2 (two) times daily.  Marland Kitchen. torsemide (DEMADEX) 100 MG tablet Take 50 mg by mouth 2 (two) times daily.  . traMADol (ULTRAM) 50 MG tablet   . XARELTO 20 MG TABS tablet    No facility-administered encounter medications on file as of 03/24/2016.   :   Review of Systems:  Out of a complete 14 point review of systems, all are reviewed and negative with the exception of these symptoms as listed below:  Review of Systems  Neurological:       Patient states that she was in the hospital during July for kidney failure. During that time she was given Dopamine and was told that afterwards she had a mild heart attack and mild stroke. The next couple of days she stuttered  and had no control over R hand.   Now she has had off and on tremors in R hand. And occasional trouble with getting words out.     Objective:  Neurologic Exam  Physical Exam Physical Examination:   Vitals:   03/24/16 1126  BP: 116/82  Pulse: 72  Resp: (!) 26   General Examination: The patient is a very pleasant 50 y.o. female in no acute distress. She appears well-developed and  well-nourished and well groomed.   HEENT: Normocephalic, atraumatic, pupils are equal, round and reactive to light and accommodation. Funduscopic exam is normal with sharp disc margins noted. Extraocular tracking is good without limitation to gaze excursion or nystagmus noted. Normal smooth pursuit is noted. Hearing is grossly intact. Tympanic membranes are clear bilaterally. Face is symmetric with normal facial animation and normal facial sensation. Speech is clear with no dysarthria noted. There is no hypophonia. There is no lip, neck/head, jaw or voice tremor. Neck is supple with full range of passive and active motion. There are no carotid bruits on auscultation. Oropharynx exam reveals: moderate mouth dryness, adequate dental hygiene and moderate airway crowding. Mallampati is class II. Tongue protrudes  centrally and palate elevates symmetrically.   Chest: Clear to auscultation without wheezing, rhonchi or crackles noted.  Heart: S1+S2+0, regular and normal without murmurs, rubs or gallops noted.   Abdomen: Soft, non-tender and non-distended with normal bowel sounds appreciated on auscultation.  Extremities: There is 2+ pitting edema in the distal lower extremities bilaterally. Pedal pulses are intact.  Skin: Chronic trophic changes noted with evidence of cellulitis, chronic appearing discoloration.   Musculoskeletal: exam reveals no obvious joint deformities, tenderness or joint swelling or erythema.   Neurologically:  Mental status: The patient is awake, alert and oriented in all 4 spheres. Her immediate and remote memory, attention, language skills and fund of knowledge are appropriate. There is no evidence of aphasia, agnosia, apraxia or anomia.   On 03/24/2016: MMSE is 28 out of 30, clock drawing was 4 out of 4, animal fluency is 15/m.  Speech is clear with normal prosody and enunciation. Thought process is linear. Mood is normal and affect is normal.  Cranial nerves II - XII are as  described above under HEENT exam. In addition: shoulder shrug is normal with equal shoulder height noted. Motor exam: Normal bulk, strength and tone is noted. There is no resting, action or postural tremor in her upper extremities. On Archimedes spiral drawing she has no significant tremor. Handwriting is non-tremulous and legible. There is no micrographia. There is no drift, tremor or rebound. Romberg is negative. Reflexes are 1+ throughout. Babinski: Toes are flexor bilaterally. Fine motor skills and coordination: intact with normal finger taps, normal hand movements, normal rapid alternating patting, normal foot taps and normal foot agility.  Cerebellar testing: No dysmetria or intention tremor on finger to nose testing. Heel to shin is unremarkable bilaterally. There is no truncal or gait ataxia.  Sensory exam: intact to light touch, pinprick, vibration, temperature sense in the upper and lower extremities.  Gait, station and balance: She stands with difficulty. No veering to one side is noted. Stands wide based. Posture is age-appropriate and stance is narrow based. Cautious gait noted, not comfortable doing tandem walk.               Assessment and Plan:    In summary, Bonnie Krause is a very pleasant 50 y.o.-year old female with an underlying complex medical history of recent hospitalization in July 2017, pulmonary hypertension, chest pain, hypertension, hypothyroidism, hyperlipidemia, obstructive sleep apnea, acute renal failure, non-STEMI, chronic venous insufficiency, congestive heart failure, and obesity, as well as recent diagnosis of right lower extremity DVT, who presents for initial consultation of an intermittent right hand tremor which started during a recent hospitalization in July 2017. On examination she has nonfocal findings, she does report some subjective memory difficulties and word finding difficulties but is able to give a very concise history and memory scores are good. Her  recent hospital records did not indicate any concern for TIA or stroke. Overall, I think we can reassure the patient. Nevertheless, the patient does have multiple vascular risk factors. I suggested we proceed with a brain MRI without contrast for completion. She is advised to use her cane for gait safety, balance problems are most likely multifactorial in her case including history of hypotension, lower extremity swelling, severe obesity. She reports compliance with her CPAP. She is encouraged to stay well-hydrated, be fully compliant with CPAP therapy, change positions slowly and use her cane or walker for gait safety. We will call her with her MRI results and if it is age-appropriate/benign, I can see  her back on an as-needed basis. I answered all her questions today and she was in agreement.   Thank you very much for allowing me to participate in the care of this nice patient. If I can be of any further assistance to you please do not hesitate to call me at [phone].  Sincerely,   Bonnie Foley, MD, PhD

## 2016-03-26 ENCOUNTER — Other Ambulatory Visit (INDEPENDENT_AMBULATORY_CARE_PROVIDER_SITE_OTHER): Payer: PRIVATE HEALTH INSURANCE

## 2016-03-26 ENCOUNTER — Ambulatory Visit (INDEPENDENT_AMBULATORY_CARE_PROVIDER_SITE_OTHER): Payer: PRIVATE HEALTH INSURANCE | Admitting: Pulmonary Disease

## 2016-03-26 ENCOUNTER — Encounter: Payer: Self-pay | Admitting: Pulmonary Disease

## 2016-03-26 DIAGNOSIS — I272 Other secondary pulmonary hypertension: Secondary | ICD-10-CM

## 2016-03-26 LAB — CBC WITH DIFFERENTIAL/PLATELET
BASOS PCT: 0.4 % (ref 0.0–3.0)
Basophils Absolute: 0 10*3/uL (ref 0.0–0.1)
EOS PCT: 3.7 % (ref 0.0–5.0)
Eosinophils Absolute: 0.3 10*3/uL (ref 0.0–0.7)
HCT: 42.3 % (ref 36.0–46.0)
Hemoglobin: 13.8 g/dL (ref 12.0–15.0)
LYMPHS ABS: 3.1 10*3/uL (ref 0.7–4.0)
Lymphocytes Relative: 36.2 % (ref 12.0–46.0)
MCHC: 32.6 g/dL (ref 30.0–36.0)
MCV: 85.2 fl (ref 78.0–100.0)
MONO ABS: 0.4 10*3/uL (ref 0.1–1.0)
MONOS PCT: 5.1 % (ref 3.0–12.0)
NEUTROS ABS: 4.7 10*3/uL (ref 1.4–7.7)
NEUTROS PCT: 54.6 % (ref 43.0–77.0)
Platelets: 284 10*3/uL (ref 150.0–400.0)
RBC: 4.96 Mil/uL (ref 3.87–5.11)
RDW: 17.4 % — AB (ref 11.5–15.5)
WBC: 8.6 10*3/uL (ref 4.0–10.5)

## 2016-03-26 LAB — COMPREHENSIVE METABOLIC PANEL
ALBUMIN: 3.5 g/dL (ref 3.5–5.2)
ALT: 8 U/L (ref 0–35)
AST: 12 U/L (ref 0–37)
Alkaline Phosphatase: 80 U/L (ref 39–117)
BUN: 26 mg/dL — AB (ref 6–23)
CO2: 29 mEq/L (ref 19–32)
Calcium: 9.3 mg/dL (ref 8.4–10.5)
Chloride: 99 mEq/L (ref 96–112)
Creatinine, Ser: 1.4 mg/dL — ABNORMAL HIGH (ref 0.40–1.20)
GFR: 42.32 mL/min — ABNORMAL LOW (ref >60.00)
Glucose, Bld: 91 mg/dL (ref 70–99)
POTASSIUM: 3.6 meq/L (ref 3.5–5.1)
SODIUM: 139 meq/L (ref 135–145)
TOTAL PROTEIN: 7 g/dL (ref 6.0–8.3)
Total Bilirubin: 0.9 mg/dL (ref 0.2–1.2)

## 2016-03-26 LAB — C-REACTIVE PROTEIN: CRP: 1.9 mg/dL (ref 0.5–20.0)

## 2016-03-26 LAB — SEDIMENTATION RATE: Sed Rate: 53 mm/hr — ABNORMAL HIGH (ref 0–20)

## 2016-03-26 NOTE — Patient Instructions (Addendum)
It is good to meet you today. We will do some Pulmonary function tests. Continue your Advair daily as you have been doing.  Remember to rinse mouth after use. Continue using Pro Air as needed for shortness of breath or wheezing up to every 6 hours. Continue taking your Xaralto 20 mg daily. We will check labs today ( Immunology panel, CMET, CBC) We will call you with results Continue on CPAP at bedtime. You appear to be benefiting from the treatment Goal is to wear for at least 4-6 hours each night for maximal clinical benefit. Continue to work on weight loss, as the link between excess weight  and sleep apnea is well established.  Do not drive if sleepy. Follow up with Dr. Isaiah SergeMannam  In 2 weeks  or before as needed. Please contact office for sooner follow up if symptoms do not improve or worsen or seek emergency care

## 2016-03-26 NOTE — Assessment & Plan Note (Addendum)
Elevated PAP per Echo and cath Plan: We will do some Pulmonary function tests. Continue your Advair daily as you have been doing.  Remember to rinse mouth after use. Continue using Pro Air as needed for shortness of breath or wheezing up to every 6 hours. Continue taking your Xaralto 20 mg daily. We will check labs today (We will check labs today ( Immunology panel, CMET, CBC) We will call you with results Continue on CPAP at bedtime. You appear to be benefiting from the treatment Goal is to wear for at least 4-6 hours each night for maximal clinical benefit. Continue to work on weight loss, as the link between excess weight  and sleep apnea is well established.  Do not drive if sleepy. Follow up with Dr. Isaiah SergeMannam  In 2 weeks or before as needed. Please contact office for sooner follow up if symptoms do not improve or worsen or seek emergency care

## 2016-03-26 NOTE — Progress Notes (Signed)
History of Present Illness Bonnie Krause is a 50 y.o. female never smoker  with a known history of hypertension, hyperlipidemia, hypothyroidism, congestive heart failure, chronic venous insufficiency,  DVT ( 01/2016) and OSA ( on CPAP)  here for consultation with Dr. Isaiah Serge  for dyspnea and pulmonary hypertension.  HPI: Pt was diagnosed with CHF in 07/2014. She has lost 100 pounds since diagnosis,partially fluid and partially weight due to diet. She had weighed up to 350 pounds prior to diagnosis.   03/26/2016 Consultation for Dyspnea and Pulmonary Hypertension. Pt. Had a hospital admission 7/10-7/22 with severe dehydration, hypotension, and AKI. She required ICU stay with IVF and pressor support. She had a suspected mild stroke while  hospitalized ( right sided weakness and slurred speech) and elevated troponin with NSTEMI. During hospitalization she was found to have Pulmonary Hypertension with Echo indicating PA Peak pressure of 114. She had positive DVT, and is currently being treated with Lesia Hausen. She is here to day for evaluation of the Pulmonary Hypertension. She states she has shortness of breath with rest and with exertion. She uses a walker. 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 She uses Advair daily and [employer] as needed. She states she uses  Her rescue inhaler about twice daily. She states she is compliant with her CPAP device without fail. This is managed by her PCP and cardiologist.  She has cellulitis to bilateral lower extremities.She denies fever, cough, purulent secretions, orthopnea or hemoptysis.  We reviewed blood thinner precautions.  Medical Team in [place], [place]  PCP: Delories Heinz, MD Cards: Norman Herrlich, MD    Admit date: 01/19/2016 Discharge date:01/31/2016  Discharge Diagnoses: Principal Problem: Pulmonary  HTN (HCC) Active Problems: Chest pain Essential hypertension HLD (hyperlipidemia) Hypothyroidism Yeast infection Nausea, vomiting, and diarrhea Abdominal pain Hypotension OSA (obstructive sleep apnea) Shock circulatory (HCC) Acute renal failure (ARF) (HCC) Hyperkalemia Bradycardia Elevated troponin EKG abnormalities NSTEMI (non-ST elevated myocardial infarction) (HCC) Pulmonary hypertension (HCC) Hypoxemia OSA on CPAP  Tests  Eco 01/21/2016:  Pulmonary arteries: Systolic pressure was severely increased. PA   peak pressure: 114 mm Hg (S).  Impressions:  - LVEF normal.   Grade 2 diastolic dysfunction with elevated filling pressures.     Severely dilated right sided chambers with severely elevated RVSP   out of proportion to the LV diastolic dysfunction.   Suspect pulmonary etiology, ? pulmonary embolism.  CXR 01/22/2016 FINDINGS: Cardiac shadow is mildly enlarged but stable. The lungs are well aerated bilaterally. Mild eventration of right hemidiaphragm is noted. No focal infiltrate is seen. No bony abnormality is noted.  IMPRESSION: No active disease.  03/26/2016 Venous Doppler Studies:  Technically difficult due to patient position and body habitus. Appears to be small segment of deep vein thrombosis noted in the right popliteal vein. No other thrombus is visualized bilaterally.  Cardiac Cath 01/23/2016  1. No angiographic evidence of CAD 2. Normal LV filling pressures 3. Severely elevated pulmonary artery pressures (mean PA pressure 84 mmHg) 4. Suspect primary pulmonary process. High suspicion for pulmonary embolism.    Past medical hx Past Medical History:  Diagnosis Date  . Acute renal failure (HCC) 01/19/2016  . Asthma   . Chronic bronchitis (HCC)   . Chronic CHF (congestive heart failure) (HCC)   . Chronic venous insufficiency   . CKD (chronic kidney disease)   . DVT (deep venous thrombosis) (  HCC)     "behind right knee; @ my left ankle" (01/26/2016)  . Essential hypertension   . Heart murmur   . HLD (hyperlipidemia)   . Hypothyroidism   . Migraine    "for the last few months" (01/26/2016)  . On home oxygen therapy    "2L; 24/7" (01/26/2016)  . OSA on CPAP   . Pneumonia "several times - 2016  . Yeast infection     Past Surgical History:  Procedure Laterality Date  . CARDIAC CATHETERIZATION N/A 01/23/2016   Procedure: Right/Left Heart Cath and Coronary Angiography;  Surgeon: Kathleene Hazel, MD;  Location: [facility] INVASIVE CV LAB;  Service: Cardiovascular;  Laterality: N/A;  . KNEE DISLOCATION SURGERY Right 1980s   Family History  Problem Relation Age of Onset  . Diabetes Mother   . Heart disease Mother   . Hypertension Mother   . Cancer Father    Social History   Social History  . Marital status: Single    Spouse name: N/A  . Number of children: 0  . Years of education: college   Social History Main Topics  . Smoking status: Never Smoker  . Smokeless tobacco: Never Used  . Alcohol use No  . Drug use: No  . Sexual activity: No   Other Topics Concern  . None   Social History Narrative   Occasionally drinks tea with caffeine.     Past surgical hx, Family hx, Social hx all reviewed.  Current Outpatient Prescriptions on File Prior to Visit  Medication Sig  . aspirin EC 81 MG EC tablet Take 1 tablet (81 mg total) by mouth daily.  Marland Kitchen atorvastatin (LIPITOR) 40 MG tablet Take 40 mg by mouth every evening.  . cholecalciferol (VITAMIN D) 1000 units tablet Take 2,000 Units by mouth daily.  . Coenzyme Q10 (COQ10 PO) Take 1 capsule by mouth daily.  Marland Kitchen levothyroxine (SYNTHROID, LEVOTHROID) 175 MCG tablet Take 175 mcg by mouth daily before breakfast.  . Omega-3 Krill Oil 300 MG CAPS Take 1 capsule by mouth daily.  Marland Kitchen omeprazole (PRILOSEC) 20 MG capsule Take 20 mg by mouth 2 (two) times daily.  . ondansetron (ZOFRAN) 4 MG tablet Take 4 mg by mouth every 8 (eight) hours as  needed for nausea or vomiting.   . potassium chloride SA (K-DUR,KLOR-CON) 20 MEQ tablet Take 20 mEq by mouth 2 (two) times daily.  Marland Kitchen torsemide (DEMADEX) 100 MG tablet Take 50 mg by mouth 2 (two) times daily.  [name] 20 MG TABS tablet Take 20 mg by mouth daily with supper.   . traMADol (ULTRAM) 50 MG tablet Take 50 mg by mouth every 6 (six) hours as needed.    No current facility-administered medications on file prior to visit.      Allergies  Allergen Reactions  . Penicillins Anaphylaxis and Hives  . Dopamine     Review Of Systems:  Constitutional:   +  weight loss, no night sweats,  Fevers, chills,+  fatigue, or  lassitude.  HEENT:   No headaches,  Difficulty swallowing,  Tooth/dental problems, or  Sore throat,                No sneezing, itching, ear ache, nasal congestion, post nasal drip,   CV:  No chest pain,  Orthopnea, PND, +swelling in lower extremities, anasarca, dizziness, palpitations, syncope.   GI  No heartburn, indigestion, abdominal pain, nausea, vomiting, diarrhea, change in bowel habits, loss of appetite, bloody stools.   Resp: + shortness  of breath with exertion and at rest.  No excess mucus, no productive cough,  No non-productive cough,  No coughing up of blood.  No change in color of mucus.  + wheezing.( at times)  No chest wall deformity  Skin: no rash or lesions.  GU: no dysuria, change in color of urine, no urgency or frequency.  No flank pain, no hematuria   MS:  + joint pain and  swelling.  + decreased range of motion.  No back pain.  Psych:  No change in mood or affect. No depression or anxiety.  No memory loss.   Vital Signs BP 116/74 (BP Location: Left Arm, Cuff Size: Normal)   Pulse 72   Ht 5\' 4"  (1.626 m)   Wt 253 lb 9.6 oz (115 kg)   SpO2 94%   BMI 43.53 kg/m    Physical Exam:  General- No distress,  A&Ox3, obese ENT: No sinus tenderness, TM clear, pale nasal mucosa, no oral exudate,no post nasal drip, no LAN Cardiac: S1, S2,  regular rate and rhythm, no murmur Chest: No wheeze/ rales/ dullness; no accessory muscle use, no nasal flaring, no sternal retractions Abd.: Soft Non-tender Ext: No clubbing cyanosis, 2-3+edema, bluish tinged Neuro:  Deconditioned using walker Skin: No rashes, warm and dry Psych: normal mood and behavior   Assessment/Plan  Pulmonary hypertension (HCC) Elevated PAP per Echo and cath Plan: We will do some Pulmonary function tests. Continue your Advair daily as you have been doing.  Remember to rinse mouth after use. Continue using Pro Air as needed for shortness of breath or wheezing up to every 6 hours. Continue taking your Xaralto 20 mg daily. We will check labs today (We will check labs today ( Immunology panel, CMET, CBC) We will call you with results Continue on CPAP at bedtime. You appear to be benefiting from the treatment Goal is to wear for at least 4-6 hours each night for maximal clinical benefit. Continue to work on weight loss, as the link between excess weight  and sleep apnea is well established.  Do not drive if sleepy. Follow up with Dr. Isaiah SergeMannam  In 2 weeks or before as needed. Please contact office for sooner follow up if symptoms do not improve or worsen or seek emergency care       Bevelyn NgoSarah F Honestie Kulik, NP 03/26/2016  4:42 PM   Attending note: I have seen and examined the patient with nurse practitioner/resident and agree with the note. History, labs and imaging reviewed.  Referred by Dr. Leonor LivHolt for evaluation of pulmonary HTN. 50 Y/O with complicated PMH as above.  She was admitted on July 2017 for hypotension, N/V, AKI. Noted to have severe pulmonary arterial HTN. Workup incuding RHC as noted above No evidence of PE, thromboembolic disease even though she has DVT,. LFTs, HIV are normal She had a negative basic autoimmune workup and has OSA which is adequately treated with CPAP  I will send a more comprehensive autoimmune work up including eval for CREST,  scleroderma as she has raynauds. She is a candidate for pulmonary vasodilators. I will probably start her on a combination of ambiertan and talafil.  Regroup in 2 weeks to review serologies and plan treatment.  Chilton GreathousePraveen Mannam MD [facility] Pager [phone] If no answer or after 3pm call: [phone] 03/27/2016, 11:11 AM   CC: Marylen PontoHolt, Lynley S, MD

## 2016-03-27 LAB — RHEUMATOID FACTOR

## 2016-03-29 LAB — ALDOLASE: ALDOLASE: 5.6 U/L (ref <=8.1)

## 2016-03-29 LAB — ANTI-SMITH ANTIBODY: ENA SM Ab Ser-aCnc: <1

## 2016-03-29 LAB — SJOGREN'S SYNDROME ANTIBODS(SSA + SSB)
SSA (Ro) (ENA) Antibody, IgG: <1
SSB (La) (ENA) Antibody, IgG: <1

## 2016-03-29 LAB — CENTROMERE ANTIBODIES: CENTROMERE AB SCREEN: POSITIVE — AB

## 2016-03-29 LAB — ANTI-SCLERODERMA ANTIBODY: Scleroderma (Scl-70) (ENA) Antibody, IgG: <1

## 2016-03-29 LAB — RNP ANTIBODY: RIBONUCLEIC PROTEIN(ENA) ANTIBODY, IGG: NEGATIVE

## 2016-03-29 LAB — ANTI-DNA ANTIBODY, DOUBLE-STRANDED

## 2016-03-29 LAB — CYCLIC CITRUL PEPTIDE ANTIBODY, IGG: Cyclic Citrullin Peptide Ab: 21 Units — ABNORMAL HIGH

## 2016-03-29 LAB — JO-1 ANTIBODY-IGG: Jo-1 Antibody, IgG: <1

## 2016-03-29 LAB — ANCA SCREEN W REFLEX TITER: ANCA Screen: NEGATIVE

## 2016-04-02 LAB — MYOSITIS PANEL III
EJ*: NEGATIVE
Jo-1 (WB)*: NEGATIVE
KU: NEGATIVE
MI-2 ANTIBODIES: NEGATIVE
OJ*: NEGATIVE
PL-12*: NEGATIVE
PL-7*: NEGATIVE
PM-SCL 100: NEGATIVE
PM-SCL 75: NEGATIVE
RNP: 20 EU/ml
RO-52*: POSITIVE — AB
Signal Recognition Particle*: NEGATIVE

## 2016-04-02 LAB — ANA COMPREHENSIVE PANEL
Anti JO-1: <0.2 AI (ref 0.0–0.9)
CHROMATIN AB SERPL-ACNC: 0.3 AI (ref 0.0–0.9)
ENA RNP AB: 0.2 AI (ref 0.0–0.9)
ENA SM Ab Ser-aCnc: <0.2 AI (ref 0.0–0.9)
ENA SSA (RO) Ab: 0.2 AI (ref 0.0–0.9)
Scleroderma SCL-70: <0.2 AI (ref 0.0–0.9)

## 2016-04-05 NOTE — Progress Notes (Signed)
ATC pt x1 > no answer and no option to LM

## 2016-04-08 ENCOUNTER — Telehealth: Payer: Self-pay | Admitting: Neurology

## 2016-04-08 NOTE — Telephone Encounter (Signed)
Pt called in stating she has decided not to have MRI.

## 2016-04-08 NOTE — Telephone Encounter (Signed)
FYI

## 2016-04-13 ENCOUNTER — Telehealth: Payer: Self-pay

## 2016-04-13 ENCOUNTER — Ambulatory Visit (INDEPENDENT_AMBULATORY_CARE_PROVIDER_SITE_OTHER): Payer: PRIVATE HEALTH INSURANCE | Admitting: Pulmonary Disease

## 2016-04-13 ENCOUNTER — Encounter (HOSPITAL_COMMUNITY): Payer: PRIVATE HEALTH INSURANCE

## 2016-04-13 ENCOUNTER — Ambulatory Visit (HOSPITAL_COMMUNITY)
Admission: RE | Admit: 2016-04-13 | Discharge: 2016-04-13 | Disposition: A | Discharge status: Home / Self Care | Payer: PRIVATE HEALTH INSURANCE | Source: Ambulatory Visit | Attending: Pulmonary Disease | Admitting: Pulmonary Disease

## 2016-04-13 ENCOUNTER — Encounter: Payer: Self-pay | Admitting: Pulmonary Disease

## 2016-04-13 VITALS — BP 106/74 | HR 68 | Ht 63.0 in | Wt 249.0 lb

## 2016-04-13 DIAGNOSIS — J449 Chronic obstructive pulmonary disease, unspecified: Secondary | ICD-10-CM | POA: Diagnosis not present

## 2016-04-13 DIAGNOSIS — R942 Abnormal results of pulmonary function studies: Secondary | ICD-10-CM | POA: Diagnosis not present

## 2016-04-13 DIAGNOSIS — I272 Pulmonary hypertension, unspecified: Secondary | ICD-10-CM | POA: Diagnosis not present

## 2016-04-13 DIAGNOSIS — M341 CR(E)ST syndrome: Secondary | ICD-10-CM

## 2016-04-13 LAB — PULMONARY FUNCTION TEST
DL/VA % PRED: 58 %
DL/VA: 2.73 ml/min/mmHg/L
DLCO UNC % PRED: 40 %
DLCO unc: 9.39 ml/min/mmHg
FEF 25-75 POST: 1.35 L/s
FEF 25-75 PRE: 1 L/s
FEF2575-%CHANGE-POST: 35 %
FEF2575-%PRED-POST: 49 %
FEF2575-%PRED-PRE: 36 %
FEV1-%Change-Post: 10 %
FEV1-%Pred-Post: 56 %
FEV1-%Pred-Pre: 50 %
FEV1-PRE: 1.37 L
FEV1-Post: 1.52 L
FEV1FVC-%CHANGE-POST: -2 %
FEV1FVC-%PRED-PRE: 90 %
FEV6-%Change-Post: 13 %
FEV6-%PRED-PRE: 55 %
FEV6-%Pred-Post: 62 %
FEV6-Post: 2.1 L
FEV6-Pre: 1.84 L
FEV6FVC-%Change-Post: 0 %
FEV6FVC-%Pred-Post: 100 %
FEV6FVC-%Pred-Pre: 100 %
FVC-%CHANGE-POST: 13 %
FVC-%PRED-PRE: 55 %
FVC-%Pred-Post: 62 %
FVC-POST: 2.14 L
FVC-PRE: 1.89 L
POST FEV1/FVC RATIO: 71 %
PRE FEV1/FVC RATIO: 73 %
Post FEV6/FVC ratio: 98 %
Pre FEV6/FVC Ratio: 97 %
RV % pred: 194 %
RV: 3.39 L
TLC % pred: 108 %
TLC: 5.32 L

## 2016-04-13 MED ORDER — TADALAFIL 20 MG PO TABS: 20.0000 mg | ORAL_TABLET | Freq: Every day | ORAL | 5 refills | Status: DC | PRN

## 2016-04-13 MED ORDER — ALBUTEROL SULFATE (2.5 MG/3ML) 0.083% IN NEBU
2.5000 mg | INHALATION_SOLUTION | Freq: Once | RESPIRATORY_TRACT | Status: AC
  Administered 2016-04-13: 2.5 mg via RESPIRATORY_TRACT

## 2016-04-13 MED ORDER — TADALAFIL (PAH) 20 MG PO TABS: 20.0000 mg | ORAL_TABLET | Freq: Every day | ORAL | 5 refills | Status: DC

## 2016-04-13 MED ORDER — AMBRISENTAN 5 MG PO TABS: 5.0000 mg | ORAL_TABLET | Freq: Every day | ORAL | 5 refills | Status: AC

## 2016-04-13 NOTE — Telephone Encounter (Signed)
Called [facility] spoke with pharmacist Rx was sent in incorrectly Verbal given to the pharmacist for Tadalafil/Adcirca 20mg  1po QD #30.  She stated this medication will need to be ordered - she will contact pt.  If med requires a PA she will contact the office  Nothing further needed, will sign off.

## 2016-04-13 NOTE — Telephone Encounter (Signed)
Spoke with pharmacist and rx was sent in with directions for Erectile Dysfunction. I verbally corrected rx. They ran it and pt's ins will only cover #6 tabs every 25 days for Pulm Htn.   PM - Please advise. Thanks!

## 2016-04-13 NOTE — Progress Notes (Signed)
Pt seen for appt 04/13/16 and results were discussed

## 2016-04-13 NOTE — Progress Notes (Signed)
Bonnie Krause    [id number]    [birth year]  Primary Care [address], MD  Referring Physician: Ronita Hipps, MD [place] [address],  Chief complaint:   Follow-up for  Pulmonary arterial hypertension [name]  HPI: [name] was seen after her hospitalization in July 2017 with dehydration, hypotension, AKI. Her stay was complicated by a mild stroke, and NSTEMI. She was noted to have pulmonary arterial hypertension. She was also noted to have a DVT and started on Xarelto anticoagulation. However there is no evidence of pulmonary embolism on VQ scan.  She returns to the clinic still short of breath and with minimal exertion. She uses a walker. 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 She has a long-standing history of Raynaud's phenomena, blue feet. She's never been evaluated for a connective tissue disease. She does not report any problems swallowing or esophageal dysfunction. She also has history of OSA and is on CPAP therapy.  Outpatient Encounter Prescriptions as of 04/13/2016  Medication Sig  . aspirin EC 81 MG EC tablet Take 1 tablet (81 mg total) by mouth daily.  Marland Kitchen atorvastatin (LIPITOR) 40 MG tablet Take 40 mg by mouth every evening.  . cholecalciferol (VITAMIN D) 1000 units tablet Take 2,000 Units by mouth daily.  . Coenzyme Q10 (COQ10 PO) Take 1 capsule by mouth daily.  Marland Kitchen levothyroxine (SYNTHROID, LEVOTHROID) 175 MCG tablet Take 175 mcg by mouth daily before breakfast.  . Omega-3 Krill Oil 300 MG CAPS Take 1 capsule by mouth daily.  Marland Kitchen omeprazole (PRILOSEC) 20 MG capsule Take 20 mg by mouth 2 (two) times daily.  . ondansetron (ZOFRAN) 4 MG tablet Take 4 mg by mouth every 8 (eight) hours as needed for nausea or vomiting.   . potassium chloride SA (K-DUR,KLOR-CON) 20 MEQ tablet Take 20 mEq by mouth 2 (two) times daily.  Marland Kitchen torsemide (DEMADEX) 100 MG tablet Take 50 mg by mouth 2 (two) times  daily.  . traMADol (ULTRAM) 50 MG tablet Take 50 mg by mouth every 6 (six) hours as needed.   Alveda Reasons 20 MG TABS tablet Take 20 mg by mouth daily with supper.    No facility-administered encounter medications on file as of 04/13/2016.     Allergies as of 04/13/2016 - Review Complete 04/13/2016  Allergen Reaction Noted  . Penicillins Anaphylaxis and Hives 01/20/2016  . Dopamine  03/24/2016    Past Medical History:  Diagnosis Date  . Acute renal failure ([facility]) 01/19/2016  . Asthma   . Chronic bronchitis ([place])   . Chronic CHF (congestive heart failure) ([place])   . Chronic venous insufficiency   . CKD (chronic kidney disease)   . DVT (deep venous thrombosis) ([place])    "behind right knee; @ my left ankle" (01/26/2016)  . Essential hypertension   . Heart murmur   . HLD (hyperlipidemia)   . Hypothyroidism   . Migraine    "for the last few months" (01/26/2016)  . On home oxygen therapy    "2L; 24/7" (01/26/2016)  . OSA on CPAP   . Pneumonia "several times - 2016  . Yeast infection     Past Surgical History:  Procedure Laterality Date  . CARDIAC CATHETERIZATION N/A 01/23/2016   Procedure: Right/Left Heart Cath and Coronary Angiography;  Surgeon: Burnell Blanks, MD;  Location: [facility];  Service: Cardiovascular;  Laterality: N/A;  . KNEE DISLOCATION SURGERY  Right 1980s    Family History  Problem Relation Age of Onset  . Diabetes Mother   . Heart disease Mother   . Hypertension Mother   . Cancer Father     Social History   Social History  . Marital status: Single    Spouse name: N/A  . Number of children: 0  . Years of education: college   Occupational History  . Not on file.   Social History Main Topics  . Smoking status: Never Smoker  . Smokeless tobacco: Never Used  . Alcohol use No  . Drug use: No  . Sexual activity: No   Other Topics Concern  . Not on file   Social History Narrative   Occasionally drinks tea with caffeine.      Review  of systems: Review of Systems  Constitutional: Negative for fever and chills.  HENT: Negative.   Eyes: Negative for blurred vision.  Respiratory: as per HPI  Cardiovascular: Negative for chest pain and palpitations.  Gastrointestinal: Negative for vomiting, diarrhea, blood per rectum. Genitourinary: Negative for dysuria, urgency, frequency and hematuria.  Musculoskeletal: Negative for myalgias, back pain and joint pain.  Skin: Negative for itching and rash.  Neurological: Negative for dizziness, tremors, focal weakness, seizures and loss of consciousness.  Endo/Heme/Allergies: Negative for environmental allergies.  Psychiatric/Behavioral: Negative for depression, suicidal ideas and hallucinations.  All other systems reviewed and are negative.   Physical Exam: Blood pressure 106/74, pulse 68, height _0  (1.6 m), weight 249 lb (112.9 kg), SpO2 98 %. Gen:      No acute distress HEENT:  EOMI, sclera anicteric Neck:     No masses; no thyromegaly Lungs:    Clear to auscultation bilaterally; normal respiratory effort CV:         Regular rate and rhythm; no murmurs Abd:      + bowel sounds; soft, non-tender; no palpable masses, no distension Ext:    1-2+ edema; adequate peripheral perfusion Skin:      Warm and dry; no rash Neuro: alert and oriented x 3 Psych: normal mood and affect  Data Reviewed:  Echo 01/21/2016: Pulmonary arteries: Systolic pressure was severely increased. PA peak pressure: 114 mm Hg (S). Impressions: - LVEF normal. Grade 2 diastolic dysfunction with elevated filling pressures. Severely dilated right sided chambers with severely elevated RVSP out of proportion to the LV diastolic dysfunction. Suspect pulmonary etiology, ? pulmonary embolism.  03/26/2016 Venous Doppler Studies: Technically difficult due to patient position and body habitus. Appears to be small segment of deep vein thrombosis noted in the right popliteal vein. No other thrombus is  visualized bilaterally.  Cardiac Cath 01/23/2016 1. No angiographic evidence of CAD 2. Normal LV filling pressures 3. Severely elevated pulmonary artery pressures (mean PA pressure 84 mmHg) 4. Suspect primary pulmonary process. High suspicion for pulmonary embolism.  V/Q scan 01/22/16 No evidence of pulmonary embolism  CT angio 01/24/16 Insufficient pulmonary artery opacification with contrast. Nondiagnostic for PE. Small right effusion, mosaic attenuation. Images reviewed.   CPAP download 03/14/16- 04/12/16 Average use 7 1/2 hrs 100% of days used > 4 hrs Average AHI 21.3  Serologies 03/26/16 Centromere ab >8 Positive CCP 21 dsDNA, Ro, La, Jo1, Scl 70, ANCA- negative LFTs- Normal HIV 01/22/16 negative  PFTs 04/13/16 FVC 2.14 [62%) FEV1 1.52 (56%) F/F 71 TLC 108% DLCO 40%. Primarily restrictive process. Even though TLC is normal , SVC is markedly reduced with reduction in FVC and FEV1. Severe reduction in DLCO.  Assessment:  #  1 Pulmonary arterial hypertension. Suspect mostly Group 1 PH. She may have a component of Group 3 as well from OSA . 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 Although she has a DVT there is no evidence of pulmonary thromboembolism I will send her to rheumatology for evaluation of CREST, scleroderma. She will be scheduled for a 6 minute walk test and started on Tadalafil, Ambisertan combination. I discussed the risk benefits of these drugs and she is agreeable to start. There is no chance for pregnancy as she is past menopause.  Follow up in 1 month with lab tests including met panel. CBC, LFTs  She is on lasix for volume control and is monitoring her weights daily.   #2 DVT Continue xarelto. The anticoagulation will be of benefit for her in pulmonary hypertension as well.  #3 OSA Stable on Bipap.  Plan/Recommendations: - Start tadalafil, ambisertan - Rheumatology referal - Continue inhalers, supplemental O2,  lasix. - Follow up in 1 month.  Marshell Garfinkel MD [facility] Pager [phone] 04/13/2016, 4:10 PM  CC: Ronita Hipps, MD

## 2016-04-13 NOTE — Patient Instructions (Signed)
We will start tadalafil 20 mg and ambrisentan 5 mg.  We will check CBC, CMP, LFTs in 1 month. Return to clinic in 1 month.  Please continue the lasix, Start taking the advair twice daily Continue the CPAP  We will refer you to rheumatology for assessment of CREST syndrome.

## 2016-04-14 ENCOUNTER — Telehealth: Payer: Self-pay | Admitting: Pulmonary Disease

## 2016-04-14 NOTE — Telephone Encounter (Signed)
Pt states that we need to contact Shon HaleMary Beth at [phone] ext [phone] ([facility]) - To discuss Letaris and Tadalafil. Pt aware that we will call and will let her know what is going on with her meds.  ---- Called the [facility] at [phone] ext [phone].  LM for Shon HaleMary Beth to return our call x 1

## 2016-04-14 NOTE — Telephone Encounter (Signed)
Per PM: would like pt to have a 6MW prior to her appt (preferrably within the next 1-2 weeks)  ATC pt at the above number > no answer and [facility] option to [facility]

## 2016-04-15 NOTE — Telephone Encounter (Signed)
Spoke with pt and advised that we will need to fill out enrollment forms to get her started on Letaris and Adcirca.  Pt also states she has not heard back from [facility] regarding meds.  Informed pt we will get started on forms and contact her with next step.  Pt verbalized understanding.

## 2016-04-15 NOTE — Telephone Encounter (Signed)
Spoke with Shon HaleMary Beth at [facility].  They received rx for Letaris but enrollment package has not been completed and sent to drug manufacturer.  They have not received anything regarding Adcirca.  Pt will need to fill out enrollment forms for both Letaris and Adcirca.  ATC pt - NA - WCB

## 2016-04-15 NOTE — Telephone Encounter (Signed)
Shon HaleMary Beth with [facility]  returning call can be reached @ [phone].Caren GriffinsStanley A Dalton

## 2016-04-15 NOTE — Telephone Encounter (Signed)
Filling out enrollment forms for Letaris and Adcirca.  Please specify dosage instructions.  Letaris choices on form are 5mg  daily for ? Weeks then increase to 10 mg daily PO or you can specify a different specific dose.  Adcirca choice is 20mg  - 2 tablets (40mg ) po qd.  Please clarify doses.

## 2016-04-16 ENCOUNTER — Telehealth: Payer: Self-pay | Admitting: Pulmonary Disease

## 2016-04-16 NOTE — Telephone Encounter (Signed)
Called spoke with Bonnie Krause at [facility]. He states that he does not show where anyone tried to contact our office. He states that the Benay Pillowdcirca is still in process and he does not show that anything is needed from our office. Will sign off on message.

## 2016-04-20 ENCOUNTER — Telehealth: Payer: Self-pay | Admitting: Pulmonary Disease

## 2016-04-20 MED ORDER — TADALAFIL (PAH) 20 MG PO TABS: 20.0000 mg | ORAL_TABLET | Freq: Every day | ORAL | 5 refills | Status: AC

## 2016-04-20 NOTE — Telephone Encounter (Signed)
Rx has been sent in. Nothing further was needed. 

## 2016-04-22 NOTE — Telephone Encounter (Signed)
ATC- NA at home number.   LMTC at mobile number

## 2016-04-26 ENCOUNTER — Telehealth: Payer: Self-pay | Admitting: Pulmonary Disease

## 2016-04-26 NOTE — Telephone Encounter (Signed)
Called [employer] and was on hold for over 15 minutes. Will try to call back later.

## 2016-04-26 NOTE — Telephone Encounter (Signed)
ATC home #, automated message states that the number cannot be reached at this time.  Spoke with patient on Cell #, unable to understand d/t her phone breaking up. I asked that the patient call me back when she gets to better service.   Please schedule for 6MW test same as her f/u appt with PM on 05/18/16. Thanks.

## 2016-04-26 NOTE — Telephone Encounter (Signed)
Patient called back and wanted PM to know that she fell last Thursday and again last night - She can be reached at [phone]

## 2016-04-26 NOTE — Telephone Encounter (Signed)
Spoke to pt.  She was returning Ashtyn's call to schedule 6mw.  I scheduled pt for walk 11/7 prior to PM appt.  Nothing further needed.

## 2016-04-27 NOTE — Telephone Encounter (Signed)
Form for PA has been received from [employer]. This has been filled out the best of my ability. There are some questions that need to be answered by PM. Form has been given to JJ. Message will be routed to [facility] for follow up.

## 2016-04-28 NOTE — Telephone Encounter (Signed)
Form filled out and signed by PM and [facility] back to [facility] @ [phone] Form placed in my to-do folder

## 2016-04-28 NOTE — Telephone Encounter (Signed)
6MW scheduled per 04/16/16 phone note Will sign off

## 2016-05-04 ENCOUNTER — Encounter: Payer: Self-pay | Admitting: Pulmonary Disease

## 2016-05-10 NOTE — Telephone Encounter (Signed)
Shanda BumpsJessica please advise if you've received anything regarding the status of this PA.  Thanks

## 2016-05-10 NOTE — Telephone Encounter (Signed)
Spoke with pt, states she received a call from [employer] stating that her Benay Pillowdcirca PA had been denied d/t incomplete documentation whether or not it would be taken with another medication, and that this would need to be appealed through [employer].  PM please advise if you'd like to appeal this.  Thanks!

## 2016-05-10 NOTE — Telephone Encounter (Signed)
Patient is calling stating she spoke [employer] regarding the Adcirca.  She states they told her it was denied due to they needed to verify it would not be taken with another certain kind of medicine (which they did not tell her a name). Told her we could appeal this.  CB for patient is [phone].

## 2016-05-12 NOTE — Telephone Encounter (Signed)
Lets appeal please. Can you get an appointment for Bonnie Krause to see Dr. Kendrick FriesMcQuaid in clinic. I would like his opinion on this case as well.

## 2016-05-12 NOTE — Telephone Encounter (Signed)
Called and spoke with [facility] and they will fax over the appeal form.  Will forward to Jess to follow up on appeal form.

## 2016-05-14 NOTE — Telephone Encounter (Signed)
Appeal form received Filled out to be best of my ability and placed in PM's red folder for completion when he returns to the office next week Will route back to myself and to PM

## 2016-05-14 NOTE — Telephone Encounter (Signed)
Called and scheduled 2nd opinion consult with BQ for 11/15. Forwarding back to Panamajessica to follow up on appeal.

## 2016-05-18 ENCOUNTER — Encounter: Payer: Self-pay | Admitting: Pulmonary Disease

## 2016-05-18 ENCOUNTER — Ambulatory Visit (INDEPENDENT_AMBULATORY_CARE_PROVIDER_SITE_OTHER): Payer: PRIVATE HEALTH INSURANCE | Admitting: Pulmonary Disease

## 2016-05-18 ENCOUNTER — Ambulatory Visit: Payer: PRIVATE HEALTH INSURANCE | Admitting: Pulmonary Disease

## 2016-05-18 VITALS — BP 106/80 | HR 86 | Ht 63.0 in | Wt 256.6 lb

## 2016-05-18 DIAGNOSIS — M341 CR(E)ST syndrome: Secondary | ICD-10-CM

## 2016-05-18 DIAGNOSIS — I272 Pulmonary hypertension, unspecified: Secondary | ICD-10-CM | POA: Diagnosis not present

## 2016-05-18 NOTE — Patient Instructions (Addendum)
We are continuing to get adcirca and letaris approved. I will coordinate care with Dr. Nickola MajorHawkes, Rheumatology and Dr. Dulce SellarMunley, Cardiologist. Bonita QuinYou will see Dr. Kendrick FriesMcQuaid next week for a second opinion.  Return in 1 month.

## 2016-05-18 NOTE — Progress Notes (Addendum)
Bonnie Krause Bonnie Krause    [id number]    August 18, 1965  Primary Care Physician:HOLT,LYNLEY S, MD  Referring Physician: Marylen PontoLynley S Holt, MD [address] [place] [id number],  Chief complaint:   Follow-up for  Pulmonary arterial hypertension [name]  HPI: Bonnie Krause was seen after her hospitalization in July 2017 with dehydration, hypotension, AKI. Her stay was complicated by a mild stroke, and NSTEMI. She was noted to have pulmonary arterial hypertension. She was also noted to have a DVT and started on Xarelto anticoagulation. However there is no evidence of pulmonary embolism on VQ scan.  She returns to the clinic still short of breath and with minimal exertion. She uses a walker. 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 She has a long-standing history of Raynaud's phenomena, digital ulceration, blue feet. She's never been evaluated for a connective tissue disease. She reports occasional problems swallowing or esophageal dysfunction. She also has history of OSA and is on CPAP therapy.  She has seen Dr. Nickola MajorHawkes, Rheumatology yesterday and repeat serologies sent. She also follows with [facility]. We are in the process of getting Adcirca and Letaris approved. Initial application has been denied and needs appeal.  Outpatient Encounter Prescriptions as of 05/18/2016  Medication Sig  . aspirin EC 81 MG EC tablet Take 1 tablet (81 mg total) by mouth daily.  Marland Kitchen. atorvastatin (LIPITOR) 40 MG tablet Take 40 mg by mouth every evening.  . cephALEXin (KEFLEX) 500 MG capsule Take 1 capsule by mouth 2 (two) times daily.  . cholecalciferol (VITAMIN D) 1000 units tablet Take 2,000 Units by mouth daily.  . Coenzyme Q10 (COQ10 PO) Take 1 capsule by mouth daily.  Marland Kitchen. levothyroxine (SYNTHROID, LEVOTHROID) 175 MCG tablet Take 175 mcg by mouth daily before breakfast.  . Omega-3 Krill Oil 300 MG CAPS Take 1 capsule by mouth daily.  Marland Kitchen.  omeprazole (PRILOSEC) 20 MG capsule Take 20 mg by mouth 2 (two) times daily.  . ondansetron (ZOFRAN) 4 MG tablet Take 4 mg by mouth every 8 (eight) hours as needed for nausea or vomiting.   . potassium chloride SA (K-DUR,KLOR-CON) 20 MEQ tablet Take 20 mEq by mouth 2 (two) times daily.  . tadalafil, PAH, (ADCIRCA) 20 MG tablet Take 1 tablet (20 mg total) by mouth daily.  Marland Kitchen. torsemide (DEMADEX) 100 MG tablet Take 50 mg by mouth 2 (two) times daily.  . traMADol (ULTRAM) 50 MG tablet Take 50 mg by mouth every 6 (six) hours as needed.   [name]. XARELTO 20 MG TABS tablet Take 20 mg by mouth daily with supper.   Marland Kitchen. ambrisentan (LETAIRIS) 5 MG tablet Take 1 tablet (5 mg total) by mouth daily. (Patient not taking: Reported on 05/18/2016)   No facility-administered encounter medications on file as of 05/18/2016.     Allergies as of 05/18/2016 - Review Complete 05/18/2016  Allergen Reaction Noted  . Penicillins Anaphylaxis and Hives 01/20/2016  . Dopamine  03/24/2016    Past Medical History:  Diagnosis Date  . Acute renal failure ([facility]) 01/19/2016  . Asthma   . Chronic bronchitis ([facility])   . Chronic CHF (congestive heart failure) ([facility])   . Chronic venous insufficiency   . CKD (chronic kidney disease)   . DVT (deep venous thrombosis) ([facility])    "behind right knee; @ my left ankle" (01/26/2016)  . Essential hypertension   . Heart murmur   . HLD (hyperlipidemia)   .  Hypothyroidism   . Migraine    "for the last few months" (01/26/2016)  . On home oxygen therapy    "2L; 24/7" (01/26/2016)  . OSA on CPAP   . Pneumonia "several times - 2016  . Yeast infection     Past Surgical History:  Procedure Laterality Date  . CARDIAC CATHETERIZATION N/A 01/23/2016   Procedure: Right/Left Heart Cath and Coronary Angiography;  Surgeon: Kathleene Hazel, MD;  Location: [facility];  Service: Cardiovascular;  Laterality: N/A;  . KNEE DISLOCATION SURGERY Right 1980s    Family History  Problem Relation Age  of Onset  . Diabetes Mother   . Heart disease Mother   . Hypertension Mother   . Cancer Father     Social History   Social History  . Marital status: Single    Spouse name: N/A  . Number of children: 0  . Years of education: college   Occupational History  . Not on file.   Social History Main Topics  . Smoking status: Never Smoker  . Smokeless tobacco: Never Used  . Alcohol use No  . Drug use: No  . Sexual activity: No   Other Topics Concern  . Not on file   Social History Narrative   Occasionally drinks tea with caffeine.      Review of systems: Review of Systems  Constitutional: Negative for fever and chills.  HENT: Negative.   Eyes: Negative for blurred vision.  Respiratory: as per HPI  Cardiovascular: Negative for chest pain and palpitations.  Gastrointestinal: Negative for vomiting, diarrhea, blood per rectum. Genitourinary: Negative for dysuria, urgency, frequency and hematuria.  Musculoskeletal: Negative for myalgias, back pain and joint pain.  Skin: Negative for itching and rash.  Neurological: Negative for dizziness, tremors, focal weakness, seizures and loss of consciousness.  Endo/Heme/Allergies: Negative for environmental allergies.  Psychiatric/Behavioral: Negative for depression, suicidal ideas and hallucinations.  All other systems reviewed and are negative.   Physical Exam: Blood pressure 106/74, pulse 68, height 5\' 3"  (1.6 m), weight 249 lb (112.9 kg), SpO2 98 %. Gen:      No acute distress HEENT:  EOMI, sclera anicteric Neck:     No masses; no thyromegaly Lungs:    Clear to auscultation bilaterally; normal respiratory effort CV:         Regular rate and rhythm; no murmurs Abd:      + bowel sounds; soft, non-tender; no palpable masses, no distension Ext:    1-2+ edema; adequate peripheral perfusion Skin:      Warm and dry; no rash Neuro: alert and oriented x 3 Psych: normal mood and affect  Data Reviewed: Cardiac Echo  01/21/2016: Pulmonary arteries: Systolic pressure was severely increased. PA peak pressure: 114 mm Hg (S). Impressions: - LVEF normal. Grade 2 diastolic dysfunction with elevated filling pressures. Severely dilated right sided chambers with severely elevated RVSP out of proportion to the LV diastolic dysfunction. Suspect pulmonary etiology, ? pulmonary embolism.  Cardiac Cath 01/23/2016 1. No angiographic evidence of CAD 2. Normal LV filling pressures 3. Severely elevated pulmonary artery pressures (mean PA pressure 84 mmHg) 4. Suspect primary pulmonary process. High suspicion for pulmonary embolism. PA pressure 135/51(84) LVEDP 7 Fick CO 3.52 LPM Unable to obtain wedge pressure due to severely dilated RV Estimated PVR based on LVEDP- 23 wood units  Imaging 03/26/2016 Venous Doppler Studies: Technically difficult due to patient position and body habitus. Appears to be small segment of deep vein thrombosis noted in the right popliteal  vein. No other thrombus is visualized bilaterally.  V/Q scan 01/22/16 No evidence of pulmonary embolism  CT angio 01/24/16 Insufficient pulmonary artery opacification with contrast. Nondiagnostic for PE. Small right effusion, mosaic attenuation. Images reviewed.   Serologies 03/26/16 Centromere ab >8 Positive CCP 21 dsDNA, Ro, La, Jo1, Scl 70, ANCA- negative LFTs- Normal HIV 01/22/16 negative  PFTs 04/13/16 FVC 2.14 [62%) FEV1 1.52 (56%) F/F 71 TLC 108% DLCO 40%. Primarily restrictive process. Even though TLC is normal , SVC is markedly reduced with reduction in FVC and FEV1. Severe reduction in DLCO.  6MW 05/18/16- 45 m. Could not complete more than 1 lap.  CPAP download 03/14/16- 04/12/16 Average use 7 1/2 hrs 100% of days used > 4 hrs Average AHI 21.3  Assessment:  #1 Pulmonary arterial hypertension. Suspect mostly Group 1 PH. She may have a component of Group 3 as well from OSA but I would not expect her to have such a  severe elevation in PA pressures from this alone. Although she has a DVT there is no evidence of pulmonary thromboembolism on VQ and CTA is non diagnostic due to insufficient contrast opacification. Her history of Raynaud's, digital ulceration with elevated centromere ab is suggestive of CREST syndrome. CCP is weakly positive but symptoms are not typical of RA.  She will follow up with rheumatology for further eval.  I will coordinate care with rheumatology and also her cardiologist. I think her diuresis needs to be increased as she still has LE edema. We are working on getting Tadalafil, Adcirca approved (denied the first time). She is also scheduled to see my partner Dr. Kendrick FriesMcQuaid for a second opinion.   #2 DVT Continue xarelto. The anticoagulation will be of benefit for her in pulmonary hypertension as well.  #3 OSA Stable on adaptive BiPAP with good response on auto download. Check overnight oximetry  Plan/Recommendations: - Appeal for tadalafil, ambisertan - Rheumatology follow up - Continue inhalers, supplemental O2, lasix. - Overnight oximetry  Chilton GreathousePraveen Benisha Hadaway MD [facility] Pager [phone] 05/18/2016, 4:42 PM  CC: Marylen PontoHolt, Lynley S, MD   Addendum: I spoke over phone with Dr Zenovia JordanAngela Hawkes, Rheumatology. She agrees that this looks like scleroderma. There may be a component of arthritis but not much joint inflammation on her exam.  She is going to discuss with the cardiologist and start low dose calcium channel blockers for Raynauds. She has started antibiotics for the infected digital ulcer.  Dina Mobley MD 05/20/2016, 7:32 PM

## 2016-05-19 ENCOUNTER — Encounter: Payer: Self-pay | Admitting: *Deleted

## 2016-05-26 ENCOUNTER — Ambulatory Visit (INDEPENDENT_AMBULATORY_CARE_PROVIDER_SITE_OTHER): Payer: PRIVATE HEALTH INSURANCE | Admitting: Pulmonary Disease

## 2016-05-26 ENCOUNTER — Encounter: Payer: Self-pay | Admitting: Pulmonary Disease

## 2016-05-26 ENCOUNTER — Other Ambulatory Visit (INDEPENDENT_AMBULATORY_CARE_PROVIDER_SITE_OTHER): Payer: PRIVATE HEALTH INSURANCE

## 2016-05-26 VITALS — BP 122/68 | HR 74 | Ht 63.0 in | Wt 263.0 lb

## 2016-05-26 DIAGNOSIS — Z5181 Encounter for therapeutic drug level monitoring: Secondary | ICD-10-CM

## 2016-05-26 DIAGNOSIS — I272 Pulmonary hypertension, unspecified: Secondary | ICD-10-CM

## 2016-05-26 LAB — COMPREHENSIVE METABOLIC PANEL
ALK PHOS: 88 U/L (ref 39–117)
ALT: 11 U/L (ref 0–35)
AST: 18 U/L (ref 0–37)
Albumin: 4 g/dL (ref 3.5–5.2)
BILIRUBIN TOTAL: 1.2 mg/dL (ref 0.2–1.2)
BUN: 32 mg/dL — ABNORMAL HIGH (ref 6–23)
CO2: 28 meq/L (ref 19–32)
CREATININE: 1.28 mg/dL — AB (ref 0.40–1.20)
Calcium: 9.4 mg/dL (ref 8.4–10.5)
Chloride: 100 mEq/L (ref 96–112)
GFR: 46.9 mL/min — ABNORMAL LOW (ref >60.00)
GLUCOSE: 79 mg/dL (ref 70–99)
Potassium: 3.7 mEq/L (ref 3.5–5.1)
Sodium: 141 mEq/L (ref 135–145)
TOTAL PROTEIN: 7.2 g/dL (ref 6.0–8.3)

## 2016-05-26 MED ORDER — TORSEMIDE 100 MG PO TABS: 100.0000 mg | ORAL_TABLET | Freq: Two times a day (BID) | ORAL | 0 refills | Status: AC

## 2016-05-26 NOTE — Progress Notes (Signed)
Subjective:    Patient ID: Bonnie Krause, female    DOB: 06-Mar-1966, 50 y.o.   MRN: [id number]  HPI Chief Complaint  Patient presents with  . Advice Only    referred by PM for second opinion on pulmonary htn.     [name] says that back in July she had kidney's failed and she was admitted for evaluation of the same.  She was previously diagnosed with CHF in 2016 and then in mid 2017 she was evaluate for nausea, vomiting, and diarrhea and she was hospitalized for evaluation of the same. She had improvement of her kidney function and she was discharged.  See details of work up detailed below.  In 2017 her symptoms have worsened significantly. Dyspnea: > occurs with just walking about 5 feet > sometimes she has dyspnea at rest, but most of the time not > she says that she has to rest 4-5 minutes after walking a few feet.  Falls: > she says that her legs give out really quickly and she can't get back up > this is not associated with losing consciousness, just leg weakness > new problem since July > now walking with a walker  She reports constant pain in her bones throughout.  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 [name] for this problem for the first time this year > she had a biopsy in her mid 20's that showed the scleroderma (lower leg biopsy)  She has a history of taking diet pills when she was in her 20's.  She says that she would keep food journals throughout her life and she took diet pills.  Specifically she says she something called "Inaman", she took Fen-fen for 5 months in her 30's as well.  She has taken other over the counter meds "lipozene" which hasn't helped.   In the last few weeks she has gained 17 pounds of fluid.  This is associated with abdominal swelling, foot swelling, and early satiety.  This is all associated with dyspnea.  She is drinking a lot of water because she  is thirsty but she says that the torsemide isn't making her urinate anymore when she takes it.    She says that she was briefly put on oxygen in July 2017 by her PCP around the time when she was hospitalized.  She follows with Dr. [name] with PCP and Dr. [name] with Cardiology.      Past Medical History:  Diagnosis Date  . Acute renal failure (HCC) 01/19/2016  . Asthma   . Chronic bronchitis (HCC)   . Chronic CHF (congestive heart failure) (HCC)   . Chronic venous insufficiency   . CKD (chronic kidney disease)   . DVT (deep venous thrombosis) (HCC)    "behind right knee; @ my left ankle" (01/26/2016)  . Essential hypertension   . Heart murmur   . HLD (hyperlipidemia)   . Hypothyroidism   . Migraine    "for the last few months" (01/26/2016)  . On home oxygen therapy    "2L; 24/7" (01/26/2016)  . OSA on CPAP   . Pneumonia "several times - 2016  . Yeast infection      Family History  Problem Relation Age of Onset  . Diabetes Mother   . Heart disease Mother   . Hypertension Mother   . Cancer Father   . Rheum arthritis Maternal Grandfather  Social History   Social History  . Marital status: Single    Spouse name: N/A  . Number of children: 0  . Years of education: college   Occupational History  . Not on file.   Social History Main Topics  . Smoking status: Never Smoker  . Smokeless tobacco: Never Used  . Alcohol use No  . Drug use: No  . Sexual activity: No   Other Topics Concern  . Not on file   Social History Narrative   Occasionally drinks tea with caffeine.      Allergies  Allergen Reactions  . Penicillins Anaphylaxis and Hives  . Dopamine      Outpatient Medications Prior to Visit  Medication Sig Dispense Refill  . ambrisentan (LETAIRIS) 5 MG tablet Take 1 tablet (5 mg total) by mouth daily. 30 tablet 5  . aspirin EC 81 MG EC tablet Take 1 tablet (81 mg total) by mouth daily. 30 tablet 0  . atorvastatin (LIPITOR) 40 MG tablet Take 40 mg by mouth  every evening.    . cephALEXin (KEFLEX) 500 MG capsule Take 1 capsule by mouth 2 (two) times daily.    . cholecalciferol (VITAMIN D) 1000 units tablet Take 2,000 Units by mouth daily.    . Coenzyme Q10 (COQ10 PO) Take 1 capsule by mouth daily.    Marland Kitchen levothyroxine (SYNTHROID, LEVOTHROID) 175 MCG tablet Take 175 mcg by mouth daily before breakfast.    . Omega-3 Krill Oil 300 MG CAPS Take 1 capsule by mouth daily.    Marland Kitchen omeprazole (PRILOSEC) 20 MG capsule Take 20 mg by mouth 2 (two) times daily.    . ondansetron (ZOFRAN) 4 MG tablet Take 4 mg by mouth every 8 (eight) hours as needed for nausea or vomiting.   0  . potassium chloride SA (K-DUR,KLOR-CON) 20 MEQ tablet Take 20 mEq by mouth 2 (two) times daily.    . tadalafil, PAH, (ADCIRCA) 20 MG tablet Take 1 tablet (20 mg total) by mouth daily. 30 tablet 5  . traMADol (ULTRAM) 50 MG tablet Take 50 mg by mouth every 6 (six) hours as needed.     [name] 20 MG TABS tablet Take 20 mg by mouth daily with supper.     . torsemide (DEMADEX) 100 MG tablet Take 50 mg by mouth 2 (two) times daily.     No facility-administered medications prior to visit.       Review of Systems  Constitutional: Positive for unexpected weight change. Negative for fever.  HENT: Positive for ear pain, postnasal drip and sinus pressure. Negative for congestion, dental problem, nosebleeds, rhinorrhea, sneezing, sore throat and trouble swallowing.   Eyes: Negative for redness and itching.  Respiratory: Positive for shortness of breath. Negative for cough, chest tightness and wheezing.   Cardiovascular: Positive for chest pain. Negative for palpitations and leg swelling.  Gastrointestinal: Negative for nausea and vomiting.  Genitourinary: Negative for dysuria.  Musculoskeletal: Negative for joint swelling.  Skin: Negative for rash.  Neurological: Negative for headaches.  Hematological: Does not bruise/bleed easily.  Psychiatric/Behavioral: Negative for dysphoric mood. The  patient is not nervous/anxious.        Objective:   Physical Exam  Vitals:   05/26/16 1101  BP: 122/68  Pulse: 74  SpO2: 94%  Weight: 263 lb (119.3 kg)  Height: 5\' 3"  (1.6 m)  RA  Gen: morbidly obese and chronically ill appearing, no acute distress HENT: NCAT, OP clear, neck supple without masses Eyes: PERRL, EOMi Lymph:  no cervical lymphadenopathy PULM: Crackles bases, normal effort CV: RRR, no mgr, no JVD GI: BS+, soft, nontender, no hsm Derm: ulceration of R digit, no rash or skin breakdown MSK: normal bulk and tone Neuro: A&Ox4, CN II-XII intact, strength 5/5 in all 4 extremities Psyche: normal mood and affect   Echo July 2017 showed increased PA pressure estimate 114 mmHg with grade 2 diastolic dysfunction and severely dilated right-sided chambers. July 2017 cardiac catheterization showed a mean PA pressure of 84 mmHg, unable to obtain wedge pressure due to severely dilated RV, estimated PVR 23 which units July 2017 VQ scan no evidence of pulmonary embolism September 2017 venous Doppler small segment of DVT and right popliteal vein July 2017 CT angiogram chest nondiagnostic for PE, small right pleural effusion Serologies from September 2017 showed a positive anticentromere antibody and CCP. HIV was negative at that time. July 2017 TSH normal Pulmonary function testing from October 2017 showed a normal ratio, FVC 2.14 L 62% predicted, normal total lung capacity at 108% predicted but a remarkably low DLCO at 40% predicted 6 walk November 2017 only 45 m cannot complete more than one lap.     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 Quite concerned about her overall prognosis at this time is guarded.  From a long-term perspective she needs to start on pulmonary hypertension vasodilator treatment right away. I agree with the plans to start combination oral therapy but I worry that she  may need to be treated with IV therapy right away as well. Clearly, her diastolic heart failure and her obesity and deconditioning contribute to her symptom burden. However, given the findings on right heart catheterization I believe that she should be treated with IV therapy.  From a short-term perspective she's gained 17 pounds in the last 2 weeks and she's having increasing symptoms. I'm going to try to help diurese her by adjusting her oral diuretics.  Plan summary: Check renal and liver function testing now, if liver function normal then start Letairis tomorrow, samples given From an oral med perspective we'll plan to escalate her rapidly to Letairis 10 mg daily and tadalafil 40 mg daily. We will continue to work with her insurance company and the drug companies to get approval for [employer]olaLet[employer]is and tadalafil I'm going to make an urgent referral to the [facility] pulmonary hypertension clinic specifically with Dr. Marlane Mingleerry Fortin as I believe she needs to be treated with IV therapy. I'm going to adjust the torsemide to 100 mg twice a day I have advised her that her symptoms worsen and if she feels that she needs to be hospitalized I recommended that [facility]. Limit fluid intake to 2L daily    Current Outpatient Prescriptions:  .  ambrisentan (LETAIRIS) 5 MG tablet, Take 1 tablet (5 mg total) by mouth daily., Disp: 30 tablet, Rfl: 5 .  aspirin EC 81 MG EC tablet, Take 1 tablet (81 mg total) by mouth daily., Disp: 30 tablet, Rfl: 0 .  atorvastatin (LIPITOR) 40 MG tablet, Take 40 mg by mouth every evening., Disp: , Rfl:  .  cephALEXin (KEFLEX) 500 MG capsule, Take 1 capsule by mouth 2 (two) times daily., Disp: , Rfl:  .  cholecalciferol (VITAMIN D) 1000 units tablet, Take 2,000 Units by mouth daily., Disp: , Rfl:  .  Coenzyme Q10 (COQ10 PO), Take 1 capsule by mouth daily., Disp: , Rfl:  .  levothyroxine (SYNTHROID, LEVOTHROID) 175 MCG tablet, Take 175 mcg by mouth daily  before breakfast., Disp: , Rfl:  .  Omega-3 Krill Oil 300 MG CAPS, Take 1 capsule by mouth daily., Disp: , Rfl:  .  omeprazole (PRILOSEC) 20 MG capsule, Take 20 mg by mouth 2 (two) times daily., Disp: , Rfl:  .  ondansetron (ZOFRAN) 4 MG tablet, Take 4 mg by mouth every 8 (eight) hours as needed for nausea or vomiting. , Disp: , Rfl: 0 .  potassium chloride SA (K-DUR,KLOR-CON) 20 MEQ tablet, Take 20 mEq by mouth 2 (two) times daily., Disp: , Rfl:  .  tadalafil, PAH, (ADCIRCA) 20 MG tablet, Take 1 tablet (20 mg total) by mouth daily., Disp: 30 tablet, Rfl: 5 .  torsemide (DEMADEX) 100 MG tablet, Take 1 tablet (100 mg total) by mouth 2 (two) times daily., Disp: 60 tablet, Rfl: 0 .  traMADol (ULTRAM) 50 MG tablet, Take 50 mg by mouth every 6 (six) hours as needed. , Disp: , Rfl:  .  XARELTO 20 MG TABS tablet, Take 20 mg by mouth daily with supper. , Disp: , Rfl:

## 2016-05-26 NOTE — Patient Instructions (Addendum)
Increase your torsemide to 100mg  twice a day Limit your fluid intake to 2 L a day We will call you with the results of today's blood work. Assuming it is normal that when she does start taking the [facility] tomorrow Once we have approval for the tadalafil we will instruct you on how to take that medicine as well We are going to make a referral for you to be seen in the pulmonary hypertension clinic at [facility] If you have problems with shortness of breath and feel that she needed go to the hospital, I recommend that she [facility] We will see you back here as previously scheduled

## 2016-05-26 NOTE — Assessment & Plan Note (Signed)
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 Quite concerned about her overall prognosis at this time is guarded.  From a long-term perspective she needs to start on pulmonary hypertension vasodilator treatment right away. I agree with the plans to start combination oral therapy but I worry that she may need to be treated with IV therapy right away as well. Clearly, her diastolic heart failure and her obesity and deconditioning contribute to her symptom burden. However, given the findings on right heart catheterization I believe that she should be treated with IV therapy.  From a short-term perspective she's gained 17 pounds in the last 2 weeks and she's having increasing symptoms. I'm going to try to help diurese her by adjusting her oral diuretics.  Plan summary: Check renal and liver function testing now, if liver function normal then start Letairis tomorrow, samples given From an oral med perspective we'll plan to escalate her rapidly to Letairis 10 mg daily and tadalafil 40 mg daily. We will continue to work with her insurance company and the drug companies to get approval for [employer]Letairis and tadalafil I'm going to make an urgent referral to the [facility] pulmonary hypertension clinic specifically with Dr. Marlane Mingleerry Fortin as I believe she needs to be treated with IV therapy. I'm going to adjust the torsemide to 100 mg twice a day I have advised her that her symptoms worsen and if she feels that she needs to be hospitalized I recommended that [facility]. Limit fluid intake to 2L daily

## 2016-05-27 NOTE — Telephone Encounter (Signed)
Jess have we received an appeal decision? Thanks.

## 2016-05-27 NOTE — Telephone Encounter (Signed)
Letter signed by PM Printed last ov note w/ PM No fax number on the [employer] denial form  Called [employer] and spoke with rep Olegario MessierKathy on 11.14.17 > fax: [phone], reference # [id number]  Appeal form, letter, ov note, med list were faxed to the above number on 11.15.17 Awaiting appeal decision

## 2016-05-28 NOTE — Telephone Encounter (Signed)
Called [employer] @ [phone] and spoke with rep Clydie BraunKaren She stated that the appeal was received but no decision has been made yet Informed Clydie BraunKaren that pt is feeling worse and asked if there was a way to expedite this process Another fax number was provided @ [phone] Appeal info faxed again to the number above with URGENT marked across cover sheet in large print

## 2016-05-28 NOTE — Telephone Encounter (Signed)
Informed pt that we are waiting on appeal decision and someone form our office will contact when we receive this. Pt voiced understanding.  Will await in decision.

## 2016-06-01 NOTE — Telephone Encounter (Signed)
Called [employer], spoke with [place] Urgent appeal request was received on 11.17.17 Appeal is still pending review at this time Will continue to monitor

## 2016-06-02 ENCOUNTER — Telehealth: Payer: Self-pay | Admitting: Pulmonary Disease

## 2016-06-02 NOTE — Telephone Encounter (Signed)
Called that pt ran out of torsemide on 11/19. New prescription called into [facility] [phone]

## 2016-06-07 ENCOUNTER — Telehealth: Payer: Self-pay | Admitting: Pulmonary Disease

## 2016-06-07 NOTE — Telephone Encounter (Signed)
Spoke with pt. States that she is having lots of pain in her legs, arms and hips. This is causing her issues with walking. She called her primary care and was instructed that she needed to call us. Pt was recently started on Adcirca and Letairis. PCP wanted us to be the ones to order a K+ blood test.  BQ - please advise. Thanks.

## 2016-06-07 NOTE — Telephone Encounter (Signed)
Spoke with pt, scheduled rov tomorrow.  Nothing further needed.

## 2016-06-07 NOTE — Telephone Encounter (Signed)
Needs office visit this week, check lab work on that visit

## 2016-06-08 ENCOUNTER — Ambulatory Visit (INDEPENDENT_AMBULATORY_CARE_PROVIDER_SITE_OTHER): Payer: PRIVATE HEALTH INSURANCE | Admitting: Pulmonary Disease

## 2016-06-08 ENCOUNTER — Other Ambulatory Visit (INDEPENDENT_AMBULATORY_CARE_PROVIDER_SITE_OTHER): Payer: PRIVATE HEALTH INSURANCE

## 2016-06-08 ENCOUNTER — Encounter: Payer: Self-pay | Admitting: Pulmonary Disease

## 2016-06-08 VITALS — BP 134/78 | HR 88 | Ht 64.0 in | Wt 262.6 lb

## 2016-06-08 DIAGNOSIS — I272 Pulmonary hypertension, unspecified: Secondary | ICD-10-CM | POA: Diagnosis not present

## 2016-06-08 DIAGNOSIS — R52 Pain, unspecified: Secondary | ICD-10-CM | POA: Diagnosis not present

## 2016-06-08 LAB — PHOSPHORUS: PHOSPHORUS: 2.8 mg/dL (ref 2.3–4.6)

## 2016-06-08 LAB — MAGNESIUM: MAGNESIUM: 1.7 mg/dL (ref 1.5–2.5)

## 2016-06-08 MED ORDER — TADALAFIL (PAH) 20 MG PO TABS: 20.0000 mg | ORAL_TABLET | Freq: Every day | ORAL | 0 refills | Status: AC

## 2016-06-08 NOTE — Patient Instructions (Signed)
Follow up in 1 month   

## 2016-06-08 NOTE — Progress Notes (Addendum)
.pccm               Shelitha Magley    [id number]    [birth year]  Primary Care Physician:HOLT,LYNLEY S, MD  Referring Physician: Marylen Ponto, MD [address] [place], [id number],  Chief complaint:   Follow-up for  Pulmonary arterial hypertension [name] Scleroderma  HPI: Ms. Covell was seen after her hospitalization in July 2017 with dehydration, hypotension, AKI. Her stay was complicated by a mild stroke, and NSTEMI. She was noted to have pulmonary arterial hypertension. She was also noted to have a DVT and started on Xarelto anticoagulation. However there is no evidence of pulmonary embolism on VQ scan.  She returns to the clinic still short of breath and with minimal exertion. She uses a walker. 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 She has a long-standing history of Raynaud's phenomena, digital ulceration, blue feet. She's never been evaluated for a connective tissue disease. She reports occasional problems swallowing or esophageal dysfunction. She also has history of OSA and is on CPAP therapy.  She has seen Dr. [name], Rheumatology and repeat serologies sent. She also follows with Dr. [name] cardiologist at [facility]. She saw my partner Dr. Kendrick Fries who started her on letaris and increased the torsemide to 100 mg twice a day. She has lost about 12 pounds but is still not back to her baseline.   Outpatient Encounter Prescriptions as of 06/08/2016  Medication Sig  . ambrisentan (LETAIRIS) 5 MG tablet Take 1 tablet (5 mg total) by mouth daily.  Marland Kitchen aspirin EC 81 MG EC tablet Take 1 tablet (81 mg total) by mouth daily.  Marland Kitchen atorvastatin (LIPITOR) 40 MG tablet Take 40 mg by mouth every evening.  . cholecalciferol (VITAMIN D) 1000 units tablet Take 2,000 Units by mouth daily.  . Coenzyme Q10 (COQ10 PO) Take 1 capsule by mouth daily.  Marland Kitchen levothyroxine (SYNTHROID, LEVOTHROID) 175 MCG tablet Take 175 mcg by mouth daily before breakfast.    . Omega-3 Krill Oil 300 MG CAPS Take 1 capsule by mouth daily.  Marland Kitchen omeprazole (PRILOSEC) 20 MG capsule Take 20 mg by mouth 2 (two) times daily.  . ondansetron (ZOFRAN) 4 MG tablet Take 4 mg by mouth every 8 (eight) hours as needed for nausea or vomiting.   . potassium chloride SA (K-DUR,KLOR-CON) 20 MEQ tablet Take 20 mEq by mouth daily.   . tadalafil, PAH, (ADCIRCA) 20 MG tablet Take 1 tablet (20 mg total) by mouth daily.  Marland Kitchen torsemide (DEMADEX) 100 MG tablet Take 1 tablet (100 mg total) by mouth 2 (two) times daily.  . traMADol (ULTRAM) 50 MG tablet Take 50 mg by mouth every 6 (six) hours as needed.   [name] 20 MG TABS tablet Take 20 mg by mouth daily with supper.   . cephALEXin (KEFLEX) 500 MG capsule Take 1 capsule by mouth 2 (two) times daily.   No facility-administered encounter medications on file as of 06/08/2016.     Allergies as of 06/08/2016 - Review Complete 06/08/2016  Allergen Reaction Noted  . Penicillins Anaphylaxis and Hives 01/20/2016  . Dopamine  03/24/2016    Past Medical History:  Diagnosis Date  . Acute renal failure (HCC) 01/19/2016  . Asthma   . Chronic bronchitis (HCC)   . Chronic CHF (congestive heart failure) (HCC)   . Chronic venous insufficiency   . CKD (chronic kidney disease)   . DVT (deep venous thrombosis) (HCC)    "behind  right knee; @ my left ankle" (01/26/2016)  . Essential hypertension   . Heart murmur   . HLD (hyperlipidemia)   . Hypothyroidism   . Migraine    "for the last few months" (01/26/2016)  . On home oxygen therapy    "2L; 24/7" (01/26/2016)  . OSA on CPAP   . Pneumonia "several times - 2016  . Yeast infection     Past Surgical History:  Procedure Laterality Date  . CARDIAC CATHETERIZATION N/A 01/23/2016   Procedure: Right/Left Heart Cath and Coronary Angiography;  Surgeon: Kathleene Hazelhristopher D McAlhany, MD;  [facility];  Service: Cardiovascular;  Laterality: N/A;  . KNEE DISLOCATION SURGERY Right 1980s    Family  History  Problem Relation Age of Onset  . Diabetes Mother   . Heart disease Mother   . Hypertension Mother   . Cancer Father   . Rheum arthritis Maternal Grandfather     Social History   Social History  . Marital status: Single    Spouse name: N/A  . Number of children: 0  . Years of education: college   Occupational History  . Not on file.   Social History Main Topics  . Smoking status: Never Smoker  . Smokeless tobacco: Never Used  . Alcohol use No  . Drug use: No  . Sexual activity: No   Other Topics Concern  . Not on file   Social History Narrative   Occasionally drinks tea with caffeine.      Review of systems: Review of Systems  Constitutional: Negative for fever and chills.  HENT: Negative.   Eyes: Negative for blurred vision.  Respiratory: as per HPI  Cardiovascular: Negative for chest pain and palpitations.  Gastrointestinal: Negative for vomiting, diarrhea, blood per rectum. Genitourinary: Negative for dysuria, urgency, frequency and hematuria.  Musculoskeletal: Negative for myalgias, back pain and joint pain.  Skin: Negative for itching and rash.  Neurological: Negative for dizziness, tremors, focal weakness, seizures and loss of consciousness.  Endo/Heme/Allergies: Negative for environmental allergies.  Psychiatric/Behavioral: Negative for depression, suicidal ideas and hallucinations.  All other systems reviewed and are negative.   Physical Exam: Blood pressure 106/74, pulse 68, height 5\' 3"  (1.6 m), weight 249 lb (112.9 kg), SpO2 98 %. Gen:      No acute distress HEENT:  EOMI, sclera anicteric Neck:     No masses; no thyromegaly Lungs:    Clear to auscultation bilaterally; normal respiratory effort CV:         Regular rate and rhythm; no murmurs Abd:      + bowel sounds; soft, non-tender; no palpable masses, no distension Ext:    1-2+ edema; adequate peripheral perfusion Skin:      Warm and dry; no rash Neuro: alert and oriented x 3 Psych:  normal mood and affect  Data Reviewed: Cardiac Echo 01/21/2016: Pulmonary arteries: Systolic pressure was severely increased. PA peak pressure: 114 mm Hg (S). Impressions: - LVEF normal. Grade 2 diastolic dysfunction with elevated filling pressures. Severely dilated right sided chambers with severely elevated RVSP out of proportion to the LV diastolic dysfunction. Suspect pulmonary etiology, ? pulmonary embolism.  Cardiac Cath 01/23/2016 1. No angiographic evidence of CAD 2. Normal LV filling pressures 3. Severely elevated pulmonary artery pressures (mean PA pressure 84 mmHg) 4. Suspect primary pulmonary process. High suspicion for pulmonary embolism. PA pressure 135/51(84) LVEDP 7 Fick CO 3.52 LPM Unable to obtain wedge pressure due to severely dilated RV Estimated PVR based on LVEDP- 23  wood units  Imaging 03/26/2016 Venous Doppler Studies: Technically difficult due to patient position and body habitus. Appears to be small segment of deep vein thrombosis noted in the right popliteal vein. No other thrombus is visualized bilaterally.  V/Q scan 01/22/16 No evidence of pulmonary embolism  CT angio 01/24/16 Insufficient pulmonary artery opacification with contrast. Nondiagnostic for PE. Small right effusion, mosaic attenuation. Images reviewed.   Serologies 03/26/16 Centromere ab >8 Positive CCP 21 dsDNA, Ro, La, Jo1, Scl 70, ANCA- negative LFTs- Normal HIV 01/22/16 negative  PFTs 04/13/16 FVC 2.14 [62%) FEV1 1.52 (56%) F/F 71 TLC 108% DLCO 40%. Primarily restrictive process. Even though TLC is normal , SVC is markedly reduced with reduction in FVC and FEV1. Severe reduction in DLCO.  6MW 05/18/16- 45 m. Could not complete more than 1 lap.  CPAP download 03/14/16- 04/12/16 Average use 7 1/2 hrs 100% of days used > 4 hrs Average AHI 21.3  Assessment:  Pulmonary arterial hypertension. Suspect mostly Group 1 PH. She may have a component of Group 3 as well  from OSA but I would not expect her to have such a severe elevation in PA pressures from this alone. Although she has a DVT there is no evidence of pulmonary thromboembolism on VQ and CTA is non diagnostic due to insufficient contrast opacification. Her history of Raynaud's, digital ulceration with elevated centromere ab is suggestive of CREST syndrome. CCP is weakly positive but symptoms are not typical of RA.    She was started on Letaris 5 mg/day on 11/15 and will be given samples of Adcirca 20mg /day today. We are still awaiting insurance approval for these drugs. [facility].   She has significant cramping after her torsemide dose was doubled. Flu swab was negative in office today. She'll continue on potassium replacement. I'll check a metabolic panel with mag and phos and replete lytes as needed. She will reduce the torsemide to 50 mg bid temporarily until we can get the cramps under control.  Plan/Recommendations: - Samples of tadalafil, ambisertan - Check and replete lytes - Reduce torsemide temporarily to 50 mg bid - Continue inhalers, supplemental O2    Chilton GreathousePraveen Alee Gressman MD [facility] Pager [phone] 06/08/2016, 4:01 PM  CC: Marylen PontoHolt, Lynley S, MD   Addendum: K is 3.0.  Will inform Gunnar Fusiaula to increase K dur to 40 meq bid. Resume torsemide to 100 mg bid after cramping has reduced. Recheck BMP in 1 week.  Chilton GreathousePraveen Shyvonne Chastang MD [facility] Pager [phone] If no answer or after 3pm call: [phone] 06/09/2016, 10:51 AM

## 2016-06-09 ENCOUNTER — Other Ambulatory Visit (INDEPENDENT_AMBULATORY_CARE_PROVIDER_SITE_OTHER): Payer: PRIVATE HEALTH INSURANCE

## 2016-06-09 DIAGNOSIS — E2839 Other primary ovarian failure: Secondary | ICD-10-CM | POA: Diagnosis not present

## 2016-06-09 LAB — BASIC METABOLIC PANEL
BUN: 21 mg/dL (ref 6–23)
CALCIUM: 9.3 mg/dL (ref 8.4–10.5)
CHLORIDE: 95 meq/L — AB (ref 96–112)
CO2: 29 mEq/L (ref 19–32)
CREATININE: 1.09 mg/dL (ref 0.40–1.20)
GFR: 56.45 mL/min — ABNORMAL LOW (ref >60.00)
Glucose, Bld: 83 mg/dL (ref 70–99)
Potassium: 3 mEq/L — ABNORMAL LOW (ref 3.5–5.1)
SODIUM: 139 meq/L (ref 135–145)

## 2016-06-09 MED ORDER — POTASSIUM CHLORIDE ER 20 MEQ PO TBCR: 2.0000 | EXTENDED_RELEASE_TABLET | Freq: Two times a day (BID) | ORAL | 0 refills | Status: AC

## 2016-06-09 NOTE — Addendum Note (Signed)
Addended by: Boone MasterJONES, JESSICA E on: 06/09/2016 11:54 AM   Modules accepted: Orders

## 2016-06-09 NOTE — Addendum Note (Signed)
Addended by: Boone MasterJONES, JESSICA E on: 06/09/2016 12:45 PM   Modules accepted: Orders

## 2016-06-10 LAB — POCT INFLUENZA A/B
INFLUENZA B, POC: NEGATIVE
Influenza A, POC: NEGATIVE

## 2016-06-10 NOTE — Telephone Encounter (Signed)
Pt seen in the office yesterday 11.29.17 [employer] and spoke with [facility] to check on status while pt was here in the office Per [facility], still pending review - despite that request was marked URGENT Pt provided with samples Will continue to monitor  Regarding pt's Bonnie FramesLetairis - pt to come to the office Monday 12.4.17 for repeat labs She will sign required areas for patient signatures when she comes for labs - she will ask for me Forms in my red lookat folder

## 2016-06-10 NOTE — Addendum Note (Signed)
Addended by: Boone MasterJONES, JESSICA E on: 06/10/2016 03:41 PM   Modules accepted: Orders

## 2016-06-14 ENCOUNTER — Other Ambulatory Visit (INDEPENDENT_AMBULATORY_CARE_PROVIDER_SITE_OTHER): Payer: PRIVATE HEALTH INSURANCE

## 2016-06-14 DIAGNOSIS — I272 Pulmonary hypertension, unspecified: Secondary | ICD-10-CM

## 2016-06-14 LAB — BASIC METABOLIC PANEL
BUN: 33 mg/dL — ABNORMAL HIGH (ref 6–23)
CALCIUM: 9.5 mg/dL (ref 8.4–10.5)
CO2: 28 meq/L (ref 19–32)
Chloride: 102 mEq/L (ref 96–112)
Creatinine, Ser: 1.34 mg/dL — ABNORMAL HIGH (ref 0.40–1.20)
GFR: 44.48 mL/min — ABNORMAL LOW (ref >60.00)
GLUCOSE: 141 mg/dL — AB (ref 70–99)
POTASSIUM: 4.1 meq/L (ref 3.5–5.1)
SODIUM: 139 meq/L (ref 135–145)

## 2016-06-14 NOTE — Telephone Encounter (Signed)
Pt came to the office today to sign forms Pt stated that since beginning the Adcirca and Letairis, she has noticed some increased SOB - this can occur at rest or activity and will randomly occur then dissipate.  She is wondering if this could be from the new medications.    PM is on nightfloat this week but will route message to him urgently

## 2016-06-15 ENCOUNTER — Telehealth: Payer: Self-pay | Admitting: Pulmonary Disease

## 2016-06-15 NOTE — Telephone Encounter (Signed)
Noted Will sign off 

## 2016-06-15 NOTE — Telephone Encounter (Signed)
I called both numbers on chart and got voice mail. Left message requesting a callback

## 2016-06-15 NOTE — Telephone Encounter (Signed)
Per the 10.16.17 phone note, pt is returning PM's call from earlier this morning regarding: Bonnie MasterJessica Karelyn Krause [facility] 06/14/2016  2:30 PM  Note   Pt came to the office today to sign forms Pt stated that since beginning the Adcirca and Letairis, she has noticed some increased SOB - this can occur at rest or activity and will randomly occur then dissipate.  She is wondering if this could be from the new medications.     PM is on nightfloat this week but will route message to him urgently     Chilton GreathousePraveen Mannam, MD 06/15/2016  9:35 AM  Note   I called both numbers on chart and got voice mail. Left message requesting a callback    +++++++++++++++++++++++++++++++++++++++++++++++++++++++++++++++++++++++++++++++++++++ Pt is aware that PM is nightfloat this week and will await his return call later today.  Pt stated she is feeling better today.

## 2016-06-15 NOTE — Telephone Encounter (Signed)
Called and spoke with [name].  I don't think the dyspnea is from the new medications but may be from the underlying pulm HTN. She is better today but will call back if symptoms worsen. BMP reviewed- Cr is slightly up to 1.34. She has lost about 20-25 lbs over the past month. I have asked her to cut back the torsemide to 50 bid and continue monitoring her weight.  She will increase the dose if the weight goes up.

## 2016-06-15 NOTE — Telephone Encounter (Signed)
Patient returned phone call..contact # [phone].Charm RingsErica R Krause

## 2016-06-15 NOTE — Telephone Encounter (Signed)
Letairis forms were faxed Still no word on the Adcirca For pt's symtpoms mentioned 12.4.17, see the 12.5.17 for further documentation

## 2016-06-21 ENCOUNTER — Ambulatory Visit: Payer: PRIVATE HEALTH INSURANCE | Admitting: Pulmonary Disease

## 2016-06-22 NOTE — Telephone Encounter (Signed)
Letairis PA form was filled out and signed by PM Faxed back to [employer]  Will continue to follow up

## 2016-06-22 NOTE — Telephone Encounter (Signed)
Bonnie Krause is requiring PA  Form filled out to the best of my ability and placed in PM's red folder for completion and signature  Called [employer] to check status of the Alber[employer]on'sdcirca Appeal, spoke with Arla Appeal is still under review > we will have a decision on 06/27/16 and information will be sent out once decision has been made.  Can call and ask for the authorization dept.  Will continue to follow

## 2016-06-25 NOTE — Telephone Encounter (Signed)
Will sign off and continue to follow

## 2016-06-28 ENCOUNTER — Telehealth: Payer: Self-pay | Admitting: Pulmonary Disease

## 2016-06-28 NOTE — Telephone Encounter (Signed)
[employer] and spoke with Tyler AasDoris  She needed our tax ID number to call ins company for dates that letairis was approved  I gave her the number and nothing further needed

## 2016-06-28 NOTE — Telephone Encounter (Addendum)
Received word today that pt's Kathalene FramesLetairis has been approved! Called [employer] to check on the Adcirca - it is still under review even though we should have had a decision by 12/17  Called spoke with patient to update her on the above Pt voiced her understanding and reported that she still has about 10 days' worth of Adcirca samples and she is scheduled to see [facility] tomorrow 12/19  Will CC Dr Isaiah SergeMannam as Lorain ChildesFYI

## 2016-07-01 MED ORDER — TADALAFIL (PAH) 20 MG PO TABS: 40.0000 mg | ORAL_TABLET | Freq: Every day | ORAL | 0 refills | Status: AC

## 2016-07-01 NOTE — Telephone Encounter (Signed)
Called [employer] to check on the Adcirca - spoke with several representatives, the last being [place] with the [employer]ization dept.  Per Bonnie BudAndre, they have NO RECORD OF THE APPEAL.  Discussed this at length with Bonnie BudAndre on how this is possible after I have been working on this for well over a month now.  No answer could be given but he was able to view the notes where I had spoken with other reps previously.  Bonnie Krause did state that he spoke with [name] in the authorization dept who is urgently emailing the [employer] and will call me today with an update.  Called spoke with patient to explain the above, apologize and offer additional samples.  Pt did see [facility] on 12/19 and will be admitted for heart cath and possible port placement for possible IV Veletri start.  She is now back on O2 and having increased SOB.  Pt is aware that Adcirca 20mg  samples are up front for her and that this info will be relayed to PM.  Will continue to follow this process

## 2016-07-01 NOTE — Addendum Note (Signed)
Addended by: Boone MasterJONES, JESSICA E on: 07/01/2016 11:14 AM   Modules accepted: Orders

## 2016-07-02 ENCOUNTER — Telehealth: Payer: Self-pay | Admitting: Pulmonary Disease

## 2016-07-02 NOTE — Telephone Encounter (Signed)
Attempted to contact [employer] about the PA that is in review and could not get a person on the phone.  Will wait for a call back.

## 2016-07-07 NOTE — Telephone Encounter (Signed)
Called [employer] back and no PA is in review at this time.  The Benay Pillowadcirca was denied back in October and the leitaris was approved in December.  Nothing further is needed

## 2016-08-12 DEATH — deceased

## 2016-09-24 ENCOUNTER — Telehealth: Payer: Self-pay | Admitting: Pulmonary Disease

## 2016-09-27 NOTE — Telephone Encounter (Signed)
Nothing documented- will close encounter.

## 2017-04-18 IMAGING — CR DG CHEST 1V PORT
1 series · 1 of 1 positions shown · non-contrast
Comparison: January 20, 2016

CLINICAL DATA: Respiratory failure

EXAM:
PORTABLE CHEST 1 VIEW

[AP]
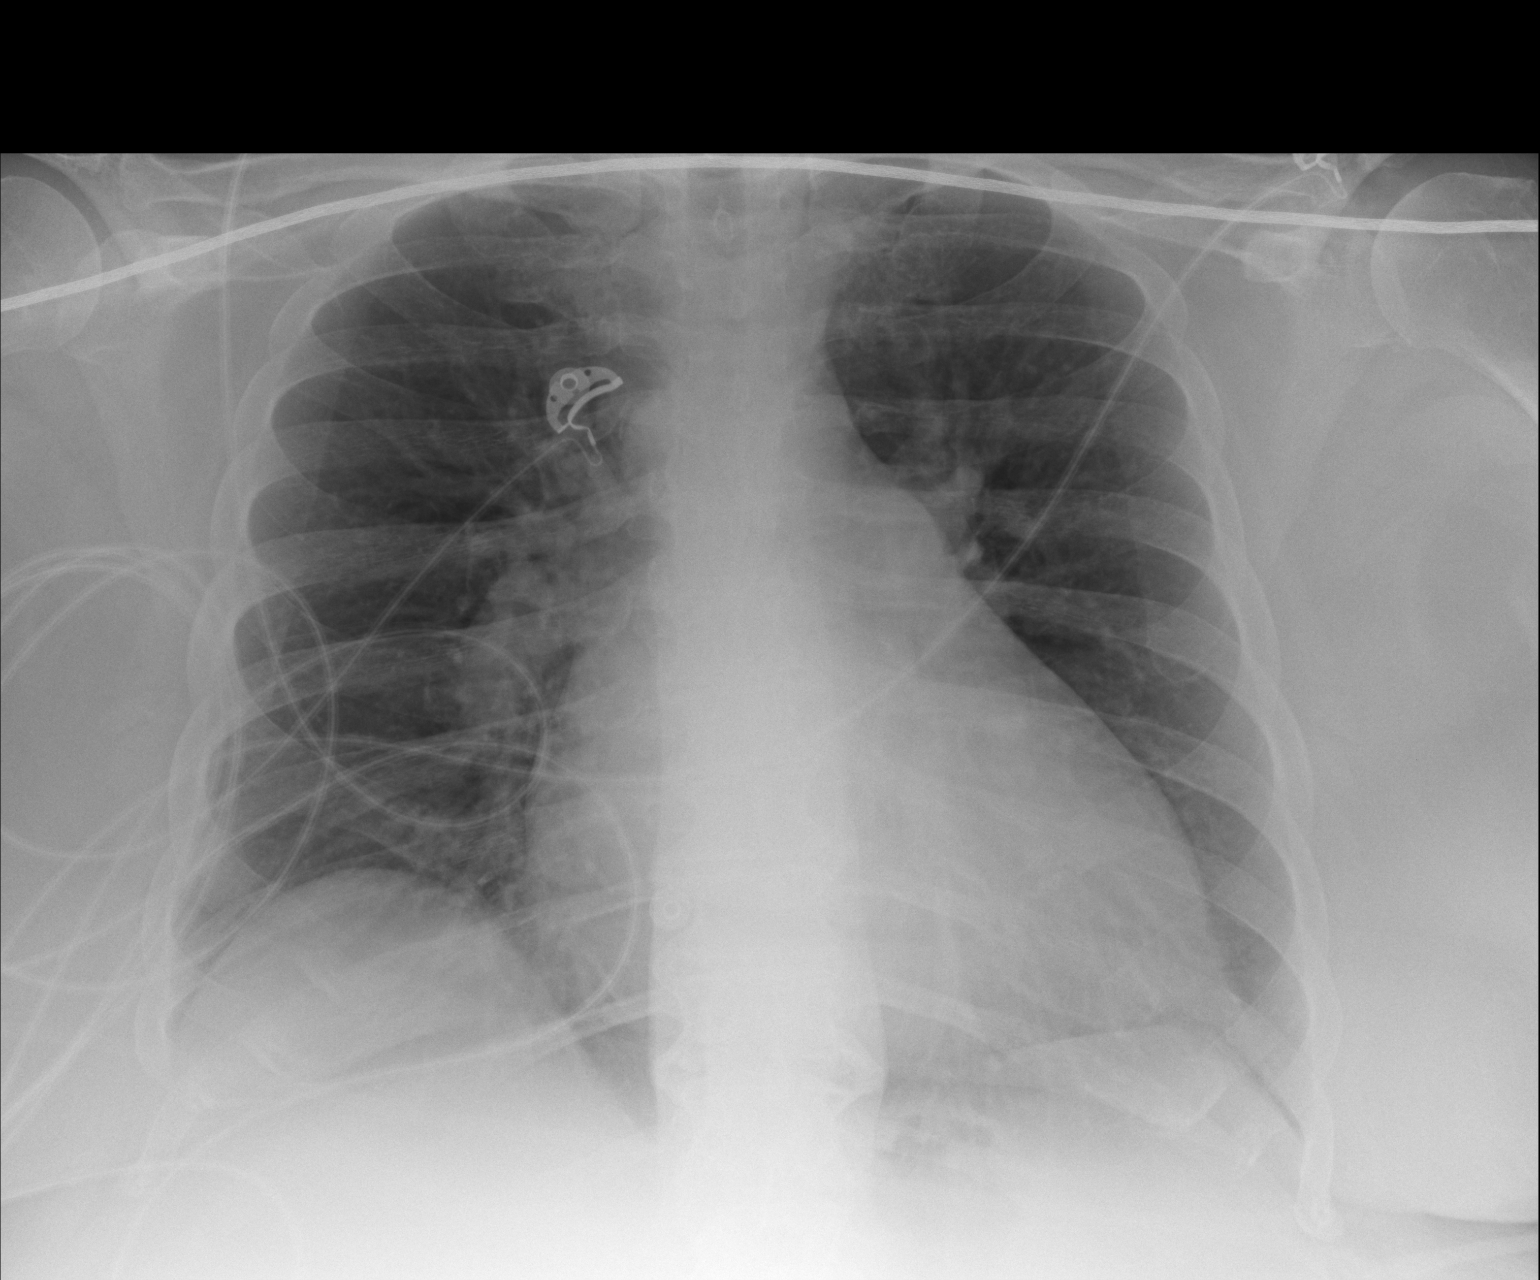

[1 of 1 positions shown; findings below may reference images not displayed]

FINDINGS: There is stable mild elevation of the right hemidiaphragm. There is
no edema or consolidation. There is cardiomegaly. There is again
noted a degree of pulmonary venous hypertension. No adenopathy
evident. No bone lesions.
IMPRESSION: Stable pulmonary vascular congestion. No frank edema or
consolidation.
